# Patient Record
Sex: Male | Born: 1940 | Race: Black or African American | Hispanic: No | Marital: Married | Smoking: Current every day smoker
Health system: Southern US, Community
[De-identification: ages and names within clinical notes are randomized; demographics above are authoritative.]

## PROBLEM LIST (undated history)

## (undated) DIAGNOSIS — R05 Cough: Secondary | ICD-10-CM

## (undated) DIAGNOSIS — R059 Cough, unspecified: Secondary | ICD-10-CM

## (undated) DIAGNOSIS — E119 Type 2 diabetes mellitus without complications: Secondary | ICD-10-CM

## (undated) DIAGNOSIS — G709 Myoneural disorder, unspecified: Secondary | ICD-10-CM

## (undated) DIAGNOSIS — N189 Chronic kidney disease, unspecified: Secondary | ICD-10-CM

## (undated) DIAGNOSIS — E785 Hyperlipidemia, unspecified: Secondary | ICD-10-CM

## (undated) DIAGNOSIS — I739 Peripheral vascular disease, unspecified: Secondary | ICD-10-CM

## (undated) DIAGNOSIS — I10 Essential (primary) hypertension: Secondary | ICD-10-CM

## (undated) DIAGNOSIS — R600 Localized edema: Secondary | ICD-10-CM

## (undated) DIAGNOSIS — J309 Allergic rhinitis, unspecified: Secondary | ICD-10-CM

## (undated) DIAGNOSIS — M1990 Unspecified osteoarthritis, unspecified site: Secondary | ICD-10-CM

## (undated) HISTORY — PX: OTHER SURGICAL HISTORY: SHX169

## (undated) HISTORY — PX: COLONOSCOPY: SHX174

---

## 2011-05-16 ENCOUNTER — Other Ambulatory Visit: Payer: Self-pay | Admitting: Nephrology

## 2011-05-18 ENCOUNTER — Ambulatory Visit
Admission: RE | Admit: 2011-05-18 | Discharge: 2011-05-18 | Disposition: A | Discharge status: Home / Self Care | Payer: Medicare Other | Source: Ambulatory Visit | Attending: Nephrology | Admitting: Nephrology

## 2011-11-29 ENCOUNTER — Encounter (HOSPITAL_COMMUNITY): Payer: Self-pay | Admitting: Pharmacy Technician

## 2011-11-29 NOTE — H&P (Signed)
TOTAL KNEE ADMISSION H&P  Patient is being admitted for left total knee arthroplasty.  Subjective:  Chief Complaint:left knee pain.  HPI: Peter Bowen., 71 y.o. male, has a history of pain and functional disability in the left knee due to arthritis and has failed non-surgical conservative treatments for greater than 12 weeks to includeNSAID's and/or analgesics, corticosteriod injections, use of assistive devices and activity modification.  Onset of symptoms was gradual, starting >10 years ago with gradually worsening course since that time. The patient noted no past surgery on the left knee(s).  Patient currently rates pain in the left knee(s) at 7 out of 10 with activity. Patient has night pain, worsening of pain with activity and weight bearing, pain that interferes with activities of daily living and crepitus.  Patient has evidence of periarticular osteophytes and joint space narrowing by imaging studies. There is no active infection.  Past Medical History Diabetes Mellitus type II  Past Surgical History No previous surgeries   Current Outpatient Prescriptions on File Prior to Encounter  Medication Sig Dispense Refill  . amLODipine-benazepril (LOTREL) 10-20 MG per capsule Take 1 capsule by mouth daily with breakfast.      . glimepiride (AMARYL) 4 MG tablet Take 4 mg by mouth 2 (two) times daily.      . Insulin Aspart Prot & Aspart (NOVOLOG MIX 70/30 FLEXPEN [place]) Inject 30 Units into the skin 3 (three) times daily as needed.      . metoprolol succinate (TOPROL-XL) 50 MG 24 hr tablet Take 50 mg by mouth daily with breakfast. Take with or immediately following a meal.      . sitaGLIPtan-metformin (JANUMET) 50-1000 MG per tablet Take 1 tablet by mouth 2 (two) times daily with a meal.      Acetaminophen 500mg  Tramadol 50mg  Zanaflex 4mg    No Known Allergies   History  Substance Use Topics  . Smoking status: Smokes about half a pack a day  . Smokeless tobacco: No  . Alcohol Use:  1-2 beers a week    Family History Father: deceased age 49 due to complications from four previous MI's Mother: deceased age 82 due to complications from DM type II; required dialysis for renal dysfunction  Review of Systems  Constitutional: Negative.   HENT: Negative.   Eyes: Negative.   Respiratory: Positive for cough. Negative for hemoptysis, sputum production, shortness of breath and wheezing.   Cardiovascular: Negative.   Gastrointestinal: Negative.   Genitourinary: Negative.   Skin: Negative.     Objective:  Physical Exam  Constitutional: He is oriented to person, place, and time. He appears well-developed and well-nourished. No distress.  HENT:  Head: Normocephalic and atraumatic.  Right Ear: External ear normal.  Left Ear: External ear normal.  Nose: Nose normal.  Mouth/Throat: Oropharynx is clear and moist.  Eyes: Conjunctivae normal and EOM are normal.  Neck: Normal range of motion. Neck supple. No thyromegaly present.  Cardiovascular: Normal rate, regular rhythm, normal heart sounds and intact distal pulses.   No murmur heard. Respiratory: Effort normal and breath sounds normal. No respiratory distress. He has no wheezes. He exhibits no tenderness.  GI: Soft. Bowel sounds are normal. He exhibits no distension and no mass. There is no tenderness.  Musculoskeletal:       Right hip: Normal.       Left hip: Normal.       Right knee: Normal.       Left knee: He exhibits decreased range of motion, swelling and  deformity. He exhibits no erythema. tenderness found. Medial joint line tenderness noted.       Legs: Lymphadenopathy:    He has no cervical adenopathy.  Neurological: He is alert and oriented to person, place, and time.  Skin: No rash noted. He is not diaphoretic. No erythema.  Psychiatric: He has a normal mood and affect. His behavior is normal. Judgment and thought content normal.    Vital signs in last 24 hours: BP: 129/74 HR: 64  Weight: 248 lb  Height: 75.5 in Body Surface Area: 2.45 m Body Mass Index: 30.59 kg/m  Imaging Review Plain radiographs demonstrate severe degenerative joint disease of the left knee(s). The overall alignment issignificant varus. The bone quality appears to be good for age and reported activity level.  Assessment/Plan:  End stage arthritis, left knee   The patient history, physical examination, clinical judgment of the provider and imaging studies are consistent with end stage degenerative joint disease of the left knee(s) and total knee arthroplasty is deemed medically necessary. The treatment options including medical management, injection therapy arthroscopy and arthroplasty were discussed at length. The risks and benefits of total knee arthroplasty were presented and reviewed. The risks due to aseptic loosening, infection, stiffness, patella tracking problems, thromboembolic complications and other imponderables were discussed. The patient acknowledged the explanation, agreed to proceed with the plan and consent was signed. Patient is being admitted for inpatient treatment for surgery, pain control, PT, OT, prophylactic antibiotics, VTE prophylaxis, progressive ambulation and ADL's and discharge planning. The patient is planning to be discharged home with home health services      [place], [place]

## 2011-12-01 ENCOUNTER — Encounter (HOSPITAL_COMMUNITY): Payer: Self-pay

## 2011-12-01 ENCOUNTER — Ambulatory Visit (HOSPITAL_COMMUNITY)
Admission: RE | Admit: 2011-12-01 | Discharge: 2011-12-01 | Disposition: A | Discharge status: Home / Self Care | Payer: Medicare Other | Source: Ambulatory Visit | Attending: Surgical | Admitting: Surgical

## 2011-12-01 ENCOUNTER — Encounter (HOSPITAL_COMMUNITY)
Admission: RE | Admit: 2011-12-01 | Discharge: 2011-12-01 | Disposition: A | Discharge status: Home / Self Care | Payer: Medicare Other | Source: Ambulatory Visit | Attending: Orthopedic Surgery | Admitting: Orthopedic Surgery

## 2011-12-01 DIAGNOSIS — Z01818 Encounter for other preprocedural examination: Secondary | ICD-10-CM | POA: Insufficient documentation

## 2011-12-01 DIAGNOSIS — Z01812 Encounter for preprocedural laboratory examination: Secondary | ICD-10-CM | POA: Insufficient documentation

## 2011-12-01 DIAGNOSIS — I10 Essential (primary) hypertension: Secondary | ICD-10-CM | POA: Insufficient documentation

## 2011-12-01 DIAGNOSIS — E119 Type 2 diabetes mellitus without complications: Secondary | ICD-10-CM | POA: Insufficient documentation

## 2011-12-01 DIAGNOSIS — I517 Cardiomegaly: Secondary | ICD-10-CM | POA: Insufficient documentation

## 2011-12-01 HISTORY — DX: Localized edema: R60.0

## 2011-12-01 HISTORY — DX: Cough: R05

## 2011-12-01 HISTORY — DX: Unspecified osteoarthritis, unspecified site: M19.90

## 2011-12-01 HISTORY — DX: Chronic kidney disease, unspecified: N18.9

## 2011-12-01 HISTORY — DX: Peripheral vascular disease, unspecified: I73.9

## 2011-12-01 HISTORY — DX: Allergic rhinitis, unspecified: J30.9

## 2011-12-01 HISTORY — DX: Hyperlipidemia, unspecified: E78.5

## 2011-12-01 HISTORY — DX: Myoneural disorder, unspecified: G70.9

## 2011-12-01 HISTORY — DX: Essential (primary) hypertension: I10

## 2011-12-01 HISTORY — DX: Type 2 diabetes mellitus without complications: E11.9

## 2011-12-01 HISTORY — DX: Cough, unspecified: R05.9

## 2011-12-01 LAB — URINALYSIS, ROUTINE W REFLEX MICROSCOPIC
Glucose, UA: 100 mg/dL — AB
Hgb urine dipstick: NEGATIVE
Leukocytes, UA: NEGATIVE
Nitrite: NEGATIVE
Protein, ur: NEGATIVE mg/dL
Specific Gravity, Urine: 1.022 (ref 1.005–1.030)
Urobilinogen, UA: 1 mg/dL (ref 0.0–1.0)
pH: 5 (ref 5.0–8.0)

## 2011-12-01 LAB — COMPREHENSIVE METABOLIC PANEL
ALT: 18 U/L (ref 0–53)
AST: 25 U/L (ref 0–37)
Albumin: 4.1 g/dL (ref 3.5–5.2)
Alkaline Phosphatase: 58 U/L (ref 39–117)
BUN: 15 mg/dL (ref 6–23)
CO2: 24 mEq/L (ref 19–32)
Calcium: 9.6 mg/dL (ref 8.4–10.5)
Chloride: 101 mEq/L (ref 96–112)
Creatinine, Ser: 1.35 mg/dL (ref 0.50–1.35)
GFR calc Af Amer: 60 mL/min — ABNORMAL LOW (ref >90)
GFR calc non Af Amer: 52 mL/min — ABNORMAL LOW (ref >90)
Glucose, Bld: 84 mg/dL (ref 70–99)
Potassium: 4.4 mEq/L (ref 3.5–5.1)
Sodium: 135 mEq/L (ref 135–145)
Total Bilirubin: 0.4 mg/dL (ref 0.3–1.2)
Total Protein: 8.5 g/dL — ABNORMAL HIGH (ref 6.0–8.3)

## 2011-12-01 LAB — CBC
MCV: 95.4 fL (ref 78.0–100.0)
Platelets: 236 10*3/uL (ref 150–400)
RBC: 3.88 MIL/uL — ABNORMAL LOW (ref 4.22–5.81)
WBC: 5.5 10*3/uL (ref 4.0–10.5)

## 2011-12-01 LAB — SURGICAL PCR SCREEN: Staphylococcus aureus: NEGATIVE

## 2011-12-01 LAB — PROTIME-INR
INR: 1.02 (ref 0.00–1.49)
Prothrombin Time: 13.3 seconds (ref 11.6–15.2)

## 2011-12-01 LAB — APTT: aPTT: 32 seconds (ref 24–37)

## 2011-12-01 NOTE — Progress Notes (Signed)
EKG, OV with clearance and recommendations dr Julio Sicks 11/17/11 on chart

## 2011-12-01 NOTE — Progress Notes (Signed)
Faxed copy of urine through [facility] for review to Dr Darrelyn Hillock

## 2011-12-01 NOTE — Patient Instructions (Signed)
20 Peter Bowen.  12/01/2011   Your procedure is scheduled on: 12/06/11  Wednesday   Surgery [id number]   Report to [facility] at   1115    AM.  Call this number if you have problems the morning of surgery: [phone]         Remember: TAKE ONE HALF INSULIN DOSAGE Tuesday NIGHT AND NO DIABETIC MEDICINES THE MORNING OF SURGERY  Do not eat food  after midnight Tuesday NIGHT       MAY HAVE CLEAR LIQUIDS Wednesday MORNING UNTIL  0515 AM   THEN NOTHING IN YOUR MOUTH BUT MEDICINES AS LISTED.   DO DRINK SOMETHING CLEAR WITH CALORIES IN IT BEFORE 515 Wednesday MORNING     Take these medicines the morning of surgery with A SIP OF WATER: METOPROLOL                                    MAY TAKE TRAMADOL WITH SIP WATER IF NEEDED    Contacts, dentures or partial plates can not be worn to surgery  Leave suitcase in the car. After surgery it may be brought to your room.  For patients admitted to the hospital, checkout time is 11:00 AM day of  discharge.             SPECIAL INSTRUCTIONS- SEE [facility] PREPARING FOR SURGERY INSTRUCTION SHEET-     DO NOT WEAR JEWELRY, LOTIONS, POWDERS, OR PERFUMES.  WOMEN-- DO NOT SHAVE LEGS OR UNDERARMS FOR 12 HOURS BEFORE SHOWERS. MEN MAY SHAVE FACE.  Patients discharged the day of surgery will not be allowed to drive home. IF going home the day of surgery, you must have a driver and someone to stay with you for the first 24 hours  Name and phone number of your driver:  wife                                                                     Please read over the following fact sheets that you were given: MRSA Information, Incentive Spirometry Sheet, Blood Transfusion Sheet  Information                                                                                   Peter Bowen  PST [phone]

## 2011-12-01 NOTE — Progress Notes (Signed)
States will take 1/2 insulin dosage night before surgery -"ONLY IF I NEED IT- MAY NOT HAVE TO TAKE IT"   I CONFIRMED THAT THIS IS CORRECT

## 2011-12-04 NOTE — Progress Notes (Signed)
12-04-11 1630 Pt. Made aware pf surgery time change to 1130 Am and to arrive by 0900 AM- do not take any Diabetic meds AM of, but take Blood pressure med as previous instructed by Lezlie Suits,RN. Donot eat or drink after Midnight except take med with water only. W. Cinderella Christoffersen,RN 

## 2011-12-06 ENCOUNTER — Inpatient Hospital Stay (HOSPITAL_COMMUNITY)
Admission: RE | Admit: 2011-12-06 | Discharge: 2011-12-12 | DRG: 469 | Disposition: A | Discharge status: Home Health | Payer: Medicare Other | Source: Ambulatory Visit | Attending: Orthopedic Surgery | Admitting: Orthopedic Surgery

## 2011-12-06 ENCOUNTER — Inpatient Hospital Stay (HOSPITAL_COMMUNITY): Payer: Medicare Other

## 2011-12-06 ENCOUNTER — Encounter (HOSPITAL_COMMUNITY): Payer: Self-pay | Admitting: Anesthesiology

## 2011-12-06 ENCOUNTER — Encounter (HOSPITAL_COMMUNITY): Payer: Self-pay | Admitting: *Deleted

## 2011-12-06 ENCOUNTER — Inpatient Hospital Stay (HOSPITAL_COMMUNITY): Payer: Medicare Other | Admitting: Anesthesiology

## 2011-12-06 ENCOUNTER — Encounter (HOSPITAL_COMMUNITY)
Admission: RE | Disposition: A | Discharge status: Home Health | Payer: Self-pay | Source: Ambulatory Visit | Attending: Orthopedic Surgery

## 2011-12-06 DIAGNOSIS — G9340 Encephalopathy, unspecified: Secondary | ICD-10-CM | POA: Diagnosis not present

## 2011-12-06 DIAGNOSIS — Z96659 Presence of unspecified artificial knee joint: Secondary | ICD-10-CM

## 2011-12-06 DIAGNOSIS — N183 Chronic kidney disease, stage 3 unspecified: Secondary | ICD-10-CM

## 2011-12-06 DIAGNOSIS — E871 Hypo-osmolality and hyponatremia: Secondary | ICD-10-CM | POA: Diagnosis present

## 2011-12-06 DIAGNOSIS — N390 Urinary tract infection, site not specified: Secondary | ICD-10-CM | POA: Diagnosis not present

## 2011-12-06 DIAGNOSIS — M24569 Contracture, unspecified knee: Secondary | ICD-10-CM | POA: Diagnosis present

## 2011-12-06 DIAGNOSIS — D62 Acute posthemorrhagic anemia: Secondary | ICD-10-CM | POA: Diagnosis not present

## 2011-12-06 DIAGNOSIS — G9349 Other encephalopathy: Secondary | ICD-10-CM | POA: Diagnosis not present

## 2011-12-06 DIAGNOSIS — I129 Hypertensive chronic kidney disease with stage 1 through stage 4 chronic kidney disease, or unspecified chronic kidney disease: Secondary | ICD-10-CM | POA: Diagnosis present

## 2011-12-06 DIAGNOSIS — M1712 Unilateral primary osteoarthritis, left knee: Secondary | ICD-10-CM | POA: Diagnosis present

## 2011-12-06 DIAGNOSIS — R509 Fever, unspecified: Secondary | ICD-10-CM

## 2011-12-06 DIAGNOSIS — M1710 Unilateral primary osteoarthritis, unspecified knee: Principal | ICD-10-CM | POA: Diagnosis present

## 2011-12-06 DIAGNOSIS — E119 Type 2 diabetes mellitus without complications: Secondary | ICD-10-CM

## 2011-12-06 DIAGNOSIS — I10 Essential (primary) hypertension: Secondary | ICD-10-CM

## 2011-12-06 DIAGNOSIS — E876 Hypokalemia: Secondary | ICD-10-CM | POA: Diagnosis not present

## 2011-12-06 HISTORY — PX: TOTAL KNEE ARTHROPLASTY: SHX125

## 2011-12-06 LAB — GLUCOSE, CAPILLARY
Glucose-Capillary: 118 mg/dL — ABNORMAL HIGH (ref 70–99)
Glucose-Capillary: 200 mg/dL — ABNORMAL HIGH (ref 70–99)

## 2011-12-06 LAB — TYPE AND SCREEN
ABO/RH(D): B POS
Antibody Screen: NEGATIVE

## 2011-12-06 LAB — ABO/RH: ABO/RH(D): B POS

## 2011-12-06 SURGERY — ARTHROPLASTY, KNEE, TOTAL
Anesthesia: General | Site: Knee | Laterality: Left | Wound class: Clean

## 2011-12-06 MED ORDER — ONDANSETRON HCL 4 MG/2ML IJ SOLN: 4.0000 mg | Freq: Four times a day (QID) | INTRAMUSCULAR | Status: DC | PRN

## 2011-12-06 MED ORDER — ALUM & MAG HYDROXIDE-SIMETH 200-200-20 MG/5ML PO SUSP: 30.0000 mL | ORAL | Status: DC | PRN

## 2011-12-06 MED ORDER — ROCURONIUM BROMIDE 100 MG/10ML IV SOLN
INTRAVENOUS | Status: DC | PRN
  Administered 2011-12-06: 50 mg via INTRAVENOUS

## 2011-12-06 MED ORDER — BENAZEPRIL HCL 20 MG PO TABS
20.0000 mg | ORAL_TABLET | Freq: Every day | ORAL | Status: DC
  Administered 2011-12-07 – 2011-12-12 (×6): 20 mg via ORAL
  Filled 2011-12-06 (×6): qty 1

## 2011-12-06 MED ORDER — ONDANSETRON HCL 4 MG PO TABS: 4.0000 mg | ORAL_TABLET | Freq: Four times a day (QID) | ORAL | Status: DC | PRN

## 2011-12-06 MED ORDER — PROMETHAZINE HCL 25 MG/ML IJ SOLN: 6.2500 mg | INTRAMUSCULAR | Status: DC | PRN

## 2011-12-06 MED ORDER — PROPOFOL 10 MG/ML IV BOLUS
INTRAVENOUS | Status: DC | PRN
  Administered 2011-12-06: 200 mg via INTRAVENOUS

## 2011-12-06 MED ORDER — AMLODIPINE BESYLATE 10 MG PO TABS
10.0000 mg | ORAL_TABLET | Freq: Once | ORAL | Status: AC
  Administered 2011-12-06: 10 mg via ORAL
  Filled 2011-12-06: qty 1

## 2011-12-06 MED ORDER — CEFAZOLIN SODIUM-DEXTROSE 2-3 GM-% IV SOLR
2.0000 g | INTRAVENOUS | Status: AC
  Administered 2011-12-06: 2 g via INTRAVENOUS

## 2011-12-06 MED ORDER — LACTATED RINGERS IV SOLN: INTRAVENOUS | Status: DC

## 2011-12-06 MED ORDER — METHOCARBAMOL 100 MG/ML IJ SOLN
500.0000 mg | Freq: Four times a day (QID) | INTRAVENOUS | Status: DC | PRN
  Administered 2011-12-06: 500 mg via INTRAVENOUS
  Filled 2011-12-06: qty 5

## 2011-12-06 MED ORDER — SODIUM CHLORIDE 0.9 % IR SOLN
Status: DC | PRN
  Administered 2011-12-06: 1

## 2011-12-06 MED ORDER — TIZANIDINE HCL 4 MG PO TABS
4.0000 mg | ORAL_TABLET | Freq: Four times a day (QID) | ORAL | Status: DC | PRN
  Filled 2011-12-06: qty 1

## 2011-12-06 MED ORDER — ONDANSETRON HCL 4 MG/2ML IJ SOLN
INTRAMUSCULAR | Status: DC | PRN
  Administered 2011-12-06: 4 mg via INTRAVENOUS

## 2011-12-06 MED ORDER — GLIMEPIRIDE 4 MG PO TABS
4.0000 mg | ORAL_TABLET | Freq: Two times a day (BID) | ORAL | Status: DC
  Administered 2011-12-07 – 2011-12-12 (×11): 4 mg via ORAL
  Filled 2011-12-06 (×15): qty 1

## 2011-12-06 MED ORDER — METOPROLOL SUCCINATE ER 50 MG PO TB24
50.0000 mg | ORAL_TABLET | Freq: Every day | ORAL | Status: DC
  Administered 2011-12-07 – 2011-12-12 (×6): 50 mg via ORAL
  Filled 2011-12-06 (×6): qty 1

## 2011-12-06 MED ORDER — AMLODIPINE BESYLATE 10 MG PO TABS
10.0000 mg | ORAL_TABLET | Freq: Every day | ORAL | Status: DC
  Administered 2011-12-07 – 2011-12-12 (×6): 10 mg via ORAL
  Filled 2011-12-06 (×6): qty 1

## 2011-12-06 MED ORDER — HYDROCODONE-ACETAMINOPHEN 5-325 MG PO TABS
1.0000 | ORAL_TABLET | ORAL | Status: DC | PRN
  Administered 2011-12-06 (×2): 1 via ORAL
  Administered 2011-12-07 – 2011-12-08 (×5): 2 via ORAL
  Administered 2011-12-08: 1 via ORAL
  Administered 2011-12-08: 2 via ORAL
  Filled 2011-12-06 (×6): qty 2
  Filled 2011-12-06: qty 1
  Filled 2011-12-06 (×2): qty 2
  Filled 2011-12-06 (×2): qty 1
  Filled 2011-12-06: qty 2
  Filled 2011-12-06: qty 1

## 2011-12-06 MED ORDER — RIVAROXABAN 10 MG PO TABS
10.0000 mg | ORAL_TABLET | Freq: Every day | ORAL | Status: DC
  Administered 2011-12-07 – 2011-12-12 (×6): 10 mg via ORAL
  Filled 2011-12-06 (×8): qty 1

## 2011-12-06 MED ORDER — POLYETHYLENE GLYCOL 3350 17 G PO PACK: 17.0000 g | PACK | Freq: Every day | ORAL | Status: DC | PRN

## 2011-12-06 MED ORDER — BUPIVACAINE LIPOSOME 1.3 % IJ SUSP
20.0000 mL | INTRAMUSCULAR | Status: AC
  Administered 2011-12-06: 20 mL
  Filled 2011-12-06: qty 20

## 2011-12-06 MED ORDER — METFORMIN HCL 500 MG PO TABS
1000.0000 mg | ORAL_TABLET | Freq: Two times a day (BID) | ORAL | Status: DC
  Administered 2011-12-07 – 2011-12-10 (×7): 1000 mg via ORAL
  Filled 2011-12-06 (×11): qty 2

## 2011-12-06 MED ORDER — MIDAZOLAM HCL 5 MG/5ML IJ SOLN
INTRAMUSCULAR | Status: DC | PRN
  Administered 2011-12-06: 2 mg via INTRAVENOUS

## 2011-12-06 MED ORDER — AMLODIPINE BESY-BENAZEPRIL HCL 10-20 MG PO CAPS: 1.0000 | ORAL_CAPSULE | Freq: Every day | ORAL | Status: DC

## 2011-12-06 MED ORDER — METHOCARBAMOL 500 MG PO TABS
500.0000 mg | ORAL_TABLET | Freq: Four times a day (QID) | ORAL | Status: DC | PRN
  Administered 2011-12-06 – 2011-12-10 (×5): 500 mg via ORAL
  Filled 2011-12-06 (×7): qty 1

## 2011-12-06 MED ORDER — ACETAMINOPHEN 325 MG PO TABS
650.0000 mg | ORAL_TABLET | Freq: Four times a day (QID) | ORAL | Status: DC | PRN
  Administered 2011-12-08 – 2011-12-12 (×6): 650 mg via ORAL
  Filled 2011-12-06 (×6): qty 2

## 2011-12-06 MED ORDER — ACETAMINOPHEN 10 MG/ML IV SOLN
INTRAVENOUS | Status: DC | PRN
  Administered 2011-12-06 (×2): 1000 mg via INTRAVENOUS

## 2011-12-06 MED ORDER — FERROUS SULFATE 325 (65 FE) MG PO TABS
325.0000 mg | ORAL_TABLET | Freq: Three times a day (TID) | ORAL | Status: DC
  Administered 2011-12-07 – 2011-12-12 (×13): 325 mg via ORAL
  Filled 2011-12-06 (×20): qty 1

## 2011-12-06 MED ORDER — PHENOL 1.4 % MT LIQD
1.0000 | OROMUCOSAL | Status: DC | PRN
  Filled 2011-12-06: qty 177

## 2011-12-06 MED ORDER — INSULIN ASPART 100 UNIT/ML ~~LOC~~ SOLN
0.0000 [IU] | Freq: Three times a day (TID) | SUBCUTANEOUS | Status: DC
  Administered 2011-12-07: 5 [IU] via SUBCUTANEOUS
  Administered 2011-12-07: 3 [IU] via SUBCUTANEOUS
  Administered 2011-12-07 (×2): 5 [IU] via SUBCUTANEOUS
  Administered 2011-12-08 (×2): 3 [IU] via SUBCUTANEOUS
  Administered 2011-12-08 – 2011-12-09 (×2): 5 [IU] via SUBCUTANEOUS
  Administered 2011-12-09: 3 [IU] via SUBCUTANEOUS
  Administered 2011-12-09: 5 [IU] via SUBCUTANEOUS
  Administered 2011-12-10 – 2011-12-11 (×2): 8 [IU] via SUBCUTANEOUS
  Administered 2011-12-11: 11 [IU] via SUBCUTANEOUS
  Administered 2011-12-11: 8 [IU] via SUBCUTANEOUS
  Administered 2011-12-12: 5 [IU] via SUBCUTANEOUS

## 2011-12-06 MED ORDER — CEFAZOLIN SODIUM 1-5 GM-% IV SOLN
1.0000 g | Freq: Four times a day (QID) | INTRAVENOUS | Status: AC
  Administered 2011-12-06 (×2): 1 g via INTRAVENOUS
  Filled 2011-12-06 (×2): qty 50

## 2011-12-06 MED ORDER — FENTANYL CITRATE 0.05 MG/ML IJ SOLN
INTRAMUSCULAR | Status: DC | PRN
  Administered 2011-12-06: 50 ug via INTRAVENOUS
  Administered 2011-12-06 (×2): 100 ug via INTRAVENOUS

## 2011-12-06 MED ORDER — LINAGLIPTIN 5 MG PO TABS
5.0000 mg | ORAL_TABLET | Freq: Every day | ORAL | Status: DC
  Administered 2011-12-07 – 2011-12-12 (×6): 5 mg via ORAL
  Filled 2011-12-06 (×6): qty 1

## 2011-12-06 MED ORDER — BISACODYL 10 MG RE SUPP: 10.0000 mg | Freq: Every day | RECTAL | Status: DC | PRN

## 2011-12-06 MED ORDER — LIDOCAINE HCL 1 % IJ SOLN
INTRAMUSCULAR | Status: DC | PRN
  Administered 2011-12-06: 50 mg via INTRADERMAL

## 2011-12-06 MED ORDER — OXYCODONE-ACETAMINOPHEN 5-325 MG PO TABS: 2.0000 | ORAL_TABLET | ORAL | Status: DC | PRN

## 2011-12-06 MED ORDER — HYDROMORPHONE HCL PF 1 MG/ML IJ SOLN
0.2500 mg | INTRAMUSCULAR | Status: DC | PRN
Start: 2011-12-06 — End: 2011-12-06
  Administered 2011-12-06: 0.5 mg via INTRAVENOUS

## 2011-12-06 MED ORDER — LACTATED RINGERS IV SOLN
INTRAVENOUS | Status: DC
  Administered 2011-12-06 (×2): 1000 mL via INTRAVENOUS

## 2011-12-06 MED ORDER — HYDROMORPHONE HCL PF 1 MG/ML IJ SOLN
1.0000 mg | INTRAMUSCULAR | Status: DC | PRN
  Administered 2011-12-06 – 2011-12-07 (×3): 1 mg via INTRAVENOUS
  Filled 2011-12-06 (×3): qty 1

## 2011-12-06 MED ORDER — LACTATED RINGERS IV SOLN
INTRAVENOUS | Status: DC
  Administered 2011-12-06 – 2011-12-08 (×4): via INTRAVENOUS

## 2011-12-06 MED ORDER — LACTATED RINGERS IV SOLN
INTRAVENOUS | Status: DC | PRN
  Administered 2011-12-06 (×3): via INTRAVENOUS

## 2011-12-06 MED ORDER — SODIUM CHLORIDE 0.9 % IR SOLN
Status: DC | PRN
  Administered 2011-12-06: 12:00:00

## 2011-12-06 MED ORDER — SITAGLIPTIN PHOS-METFORMIN HCL 50-1000 MG PO TABS: 1.0000 | ORAL_TABLET | Freq: Two times a day (BID) | ORAL | Status: DC

## 2011-12-06 MED ORDER — HYDROMORPHONE HCL PF 1 MG/ML IJ SOLN
INTRAMUSCULAR | Status: DC | PRN
  Administered 2011-12-06 (×2): 1 mg via INTRAVENOUS

## 2011-12-06 MED ORDER — THROMBIN 5000 UNITS EX SOLR
CUTANEOUS | Status: DC | PRN
  Administered 2011-12-06: 5000 [IU] via TOPICAL

## 2011-12-06 MED ORDER — ACETAMINOPHEN 650 MG RE SUPP: 650.0000 mg | Freq: Four times a day (QID) | RECTAL | Status: DC | PRN

## 2011-12-06 MED ORDER — FLEET ENEMA 7-19 GM/118ML RE ENEM: 1.0000 | ENEMA | Freq: Once | RECTAL | Status: AC | PRN

## 2011-12-06 MED ORDER — MENTHOL 3 MG MT LOZG
1.0000 | LOZENGE | OROMUCOSAL | Status: DC | PRN
  Filled 2011-12-06: qty 9

## 2011-12-06 SURGICAL SUPPLY — 66 items
BAG ZIPLOCK 12X15 (MISCELLANEOUS) ×4 IMPLANT
BANDAGE ELASTIC 4 VELCRO ST LF (GAUZE/BANDAGES/DRESSINGS) ×2 IMPLANT
BANDAGE ELASTIC 6 VELCRO ST LF (GAUZE/BANDAGES/DRESSINGS) ×2 IMPLANT
BANDAGE ESMARK 6X9 LF (GAUZE/BANDAGES/DRESSINGS) ×1 IMPLANT
BANDAGE GAUZE ELAST BULKY 4 IN (GAUZE/BANDAGES/DRESSINGS) ×2 IMPLANT
BLADE SAG 18X100X1.27 (BLADE) ×2 IMPLANT
BLADE SAW SGTL 11.0X1.19X90.0M (BLADE) ×2 IMPLANT
BNDG ESMARK 6X9 LF (GAUZE/BANDAGES/DRESSINGS) ×2
BONE CEMENT GENTAMICIN (Cement) ×4 IMPLANT
CEMENT BONE GENTAMICIN 40 (Cement) ×2 IMPLANT
CLOTH BEACON ORANGE TIMEOUT ST (SAFETY) ×2 IMPLANT
CLSR STERI-STRIP ANTIMIC 1/2X4 (GAUZE/BANDAGES/DRESSINGS) ×2 IMPLANT
CUFF TOURN SGL QUICK 34 (TOURNIQUET CUFF) ×1
CUFF TRNQT CYL 34X4X40X1 (TOURNIQUET CUFF) ×1 IMPLANT
DRAPE EXTREMITY T 121X128X90 (DRAPE) ×2 IMPLANT
DRAPE INCISE IOBAN 66X45 STRL (DRAPES) ×2 IMPLANT
DRAPE LG THREE QUARTER DISP (DRAPES) ×2 IMPLANT
DRAPE POUCH INSTRU U-SHP 10X18 (DRAPES) ×2 IMPLANT
DRAPE U-SHAPE 47X51 STRL (DRAPES) ×2 IMPLANT
DRSG ADAPTIC 3X8 NADH LF (GAUZE/BANDAGES/DRESSINGS) ×2 IMPLANT
DRSG PAD ABDOMINAL 8X10 ST (GAUZE/BANDAGES/DRESSINGS) ×2 IMPLANT
DURAPREP 26ML APPLICATOR (WOUND CARE) ×2 IMPLANT
ELECT REM PT RETURN 9FT ADLT (ELECTROSURGICAL) ×2
ELECTRODE REM PT RTRN 9FT ADLT (ELECTROSURGICAL) ×1 IMPLANT
EVACUATOR 1/8 PVC DRAIN (DRAIN) ×2 IMPLANT
FACESHIELD LNG OPTICON STERILE (SAFETY) ×12 IMPLANT
GLOVE BIOGEL PI IND STRL 8 (GLOVE) ×1 IMPLANT
GLOVE BIOGEL PI IND STRL 8.5 (GLOVE) IMPLANT
GLOVE BIOGEL PI INDICATOR 8 (GLOVE) ×1
GLOVE BIOGEL PI INDICATOR 8.5 (GLOVE)
GLOVE ECLIPSE 8.0 STRL XLNG CF (GLOVE) ×6 IMPLANT
GLOVE SURG SS PI 6.5 STRL IVOR (GLOVE) ×4 IMPLANT
GOWN PREVENTION PLUS LG XLONG (DISPOSABLE) ×4 IMPLANT
GOWN STRL REIN XL XLG (GOWN DISPOSABLE) ×2 IMPLANT
HANDPIECE INTERPULSE COAX TIP (DISPOSABLE) ×1
IMMOBILIZER KNEE 20 (SOFTGOODS) ×2
IMMOBILIZER KNEE 20 THIGH 36 (SOFTGOODS) ×1 IMPLANT
KIT BASIN OR (CUSTOM PROCEDURE TRAY) ×2 IMPLANT
MANIFOLD NEPTUNE II (INSTRUMENTS) ×2 IMPLANT
NEEDLE HYPO 22GX1.5 SAFETY (NEEDLE) ×2 IMPLANT
NS IRRIG 1000ML POUR BTL (IV SOLUTION) IMPLANT
PACK TOTAL JOINT (CUSTOM PROCEDURE TRAY) ×2 IMPLANT
POSITIONER SURGICAL ARM (MISCELLANEOUS) ×2 IMPLANT
SET HNDPC FAN SPRY TIP SCT (DISPOSABLE) ×1 IMPLANT
SET PAD KNEE POSITIONER (MISCELLANEOUS) ×2 IMPLANT
SPONGE GAUZE 4X4 12PLY (GAUZE/BANDAGES/DRESSINGS) ×2 IMPLANT
SPONGE LAP 18X18 X RAY DECT (DISPOSABLE) ×2 IMPLANT
SPONGE SURGIFOAM ABS GEL 100 (HEMOSTASIS) ×2 IMPLANT
STAPLER VISISTAT 35W (STAPLE) IMPLANT
SUCTION FRAZIER 12FR DISP (SUCTIONS) ×2 IMPLANT
SUT BONE WAX W31G (SUTURE) ×2 IMPLANT
SUT MNCRL 0 MO-4 VIOLET 18 CR (SUTURE) ×1 IMPLANT
SUT MONOCRYL 0 MO 4 18  CR/8 (SUTURE) ×1
SUT VIC AB 0 CT1 27 (SUTURE) ×2
SUT VIC AB 0 CT1 27XBRD ANTBC (SUTURE) ×2 IMPLANT
SUT VIC AB 1 CT1 27 (SUTURE) ×6
SUT VIC AB 1 CT1 27XBRD ANTBC (SUTURE) ×6 IMPLANT
SUT VIC AB 2-0 CT1 27 (SUTURE) ×3
SUT VIC AB 2-0 CT1 TAPERPNT 27 (SUTURE) ×3 IMPLANT
SUT VLOC 180 0 24IN GS25 (SUTURE) ×2 IMPLANT
SYR 20CC LL (SYRINGE) ×2 IMPLANT
TOWEL OR 17X26 10 PK STRL BLUE (TOWEL DISPOSABLE) ×4 IMPLANT
TOWER CARTRIDGE SMART MIX (DISPOSABLE) ×2 IMPLANT
TRAY FOLEY CATH 14FRSI W/METER (CATHETERS) ×2 IMPLANT
WATER STERILE IRR 1500ML POUR (IV SOLUTION) ×6 IMPLANT
WRAP KNEE MAXI GEL POST OP (GAUZE/BANDAGES/DRESSINGS) ×4 IMPLANT

## 2011-12-06 NOTE — Anesthesia Preprocedure Evaluation (Signed)
Anesthesia Evaluation  Patient identified by MRN, date of birth, ID band Patient awake    Reviewed: Allergy & Precautions, H&P , NPO status , Patient's Chart, lab work & pertinent test results  Airway Mallampati: II TM Distance: >3 FB Neck ROM: Full    Dental  (+) Partial Upper and Edentulous Lower   Pulmonary neg pulmonary ROS,  breath sounds clear to auscultation  Pulmonary exam normal       Cardiovascular hypertension, Pt. on medications and Pt. on home beta blockers + Peripheral Vascular Disease Rhythm:Regular Rate:Normal     Neuro/Psych  Neuromuscular disease negative neurological ROS  negative psych ROS   GI/Hepatic negative GI ROS, Neg liver ROS,   Endo/Other  negative endocrine ROSdiabetes, Type 2  Renal/GU Renal InsufficiencyRenal disease  negative genitourinary   Musculoskeletal negative musculoskeletal ROS (+)   Abdominal   Peds  Hematology negative hematology ROS (+)   Anesthesia Other Findings   Reproductive/Obstetrics negative OB ROS                           Anesthesia Physical Anesthesia Plan  ASA: II  Anesthesia Plan: General   Post-op Pain Management:    Induction: Intravenous  Airway Management Planned: Oral ETT  Additional Equipment:   Intra-op Plan:   Post-operative Plan: Extubation in OR  Informed Consent: I have reviewed the patients History and Physical, chart, labs and discussed the procedure including the risks, benefits and alternatives for the proposed anesthesia with the patient or authorized representative who has indicated his/her understanding and acceptance.   Dental advisory given  Plan Discussed with: CRNA  Anesthesia Plan Comments:         Anesthesia Quick Evaluation

## 2011-12-06 NOTE — H&P (View-Only) (Signed)
12-04-11 1630 Pt. Made aware pf surgery time change to 1130 Am and to arrive by 0900 AM- do not take any Diabetic meds AM of, but take Blood pressure med as previous instructed by Haze Justin. Donot eat or drink after Midnight except take med with water only. W. Kennon Portela

## 2011-12-06 NOTE — Anesthesia Postprocedure Evaluation (Signed)
Anesthesia Post Note  Patient: Peter Bowen.  Procedure(s) Performed: Procedure(s) (LRB): TOTAL KNEE ARTHROPLASTY (Left)  Anesthesia type: General  Patient location: PACU  Post pain: Pain level controlled  Post assessment: Post-op Vital signs reviewed  Last Vitals:  Filed Vitals:   12/06/11 1515  BP: 129/74  Pulse: 64  Temp:   Resp: 11    Post vital signs: Reviewed  Level of consciousness: sedated  Complications: No apparent anesthesia complications

## 2011-12-06 NOTE — Interval H&P Note (Signed)
History and Physical Interval Note:  12/06/2011 11:00 AM  Peter Bowen.  has presented today for surgery, with the diagnosis of Osteoarthritis of the Left Knee  The various methods of treatment have been discussed with the patient and family. After consideration of risks, benefits and other options for treatment, the patient has consented to  Procedure(s) (LRB) with comments: TOTAL KNEE ARTHROPLASTY (Left) as a surgical intervention .  The patient's history has been reviewed, patient examined, no change in status, stable for surgery.  I have reviewed the patient's chart and labs.  Questions were answered to the patient's satisfaction.     Peter Bowen A

## 2011-12-06 NOTE — Transfer of Care (Signed)
Immediate Anesthesia Transfer of Care Note  Patient: Peter Bowen.  Procedure(s) Performed: Procedure(s) (LRB) with comments: TOTAL KNEE ARTHROPLASTY (Left)  Patient Location: PACU  Anesthesia Type: General  Level of Consciousness: awake and alert   Airway & Oxygen Therapy: Patient Spontanous Breathing and Patient connected to face mask oxygen  Post-op Assessment: Report given to PACU RN and Post -op Vital signs reviewed and stable  Post vital signs: Reviewed and stable  Complications: No apparent anesthesia complications

## 2011-12-06 NOTE — Brief Op Note (Signed)
12/06/2011  2:00 PM  PATIENT:  Peter Bowen.  71 y.o. 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  SURGEON:  Surgeon(s) and Role:    * Jacki Cones, MD - Primary  PHYSICIAN ASSISTANT: Dimitri Ped PA  ASSISTANTS: Dimitri Ped PA   ANESTHESIA:   general  EBL:  Total I/O In: 2000 [I.V.:2000] Out: 450 [Urine:250; Blood:200]  BLOOD ADMINISTERED:none  DRAINS: (one) Hemovact drain(s) in the Left with  Suction Open   LOCAL MEDICATIONS USED:  BUPIVICAINE20cc mixed with 20cc Normal Saline  SPECIMEN:  No Specimen  DISPOSITION OF SPECIMEN:  N/A  COUNTS:  YES  TOURNIQUET:   Total Tourniquet Time Documented: Thigh (Left) - 123 minutes  DICTATION: .Other Dictation: Dictation Number [id number]  PLAN OF CARE: Admit to inpatient   PATIENT DISPOSITION:  Stable in OR   Delay start of Pharmacological VTE agent (>24hrs) due to surgical blood loss or risk of bleeding: yes

## 2011-12-06 NOTE — Plan of Care (Signed)
Problem: Consults Goal: Diagnosis- Total Joint Replacement Outcome: Completed/Met Date Met:  12/06/11 Right total knee

## 2011-12-07 ENCOUNTER — Encounter (HOSPITAL_COMMUNITY): Payer: Self-pay | Admitting: Orthopedic Surgery

## 2011-12-07 DIAGNOSIS — D62 Acute posthemorrhagic anemia: Secondary | ICD-10-CM | POA: Diagnosis not present

## 2011-12-07 LAB — CBC
HCT: 28.4 % — ABNORMAL LOW (ref 39.0–52.0)
Hemoglobin: 9.8 g/dL — ABNORMAL LOW (ref 13.0–17.0)
MCV: 95.3 fL (ref 78.0–100.0)
Platelets: 179 10*3/uL (ref 150–400)
RBC: 2.98 MIL/uL — ABNORMAL LOW (ref 4.22–5.81)
WBC: 6.8 10*3/uL (ref 4.0–10.5)

## 2011-12-07 LAB — GLUCOSE, CAPILLARY
Glucose-Capillary: 224 mg/dL — ABNORMAL HIGH (ref 70–99)
Glucose-Capillary: 222 mg/dL — ABNORMAL HIGH (ref 70–99)
Glucose-Capillary: 203 mg/dL — ABNORMAL HIGH (ref 70–99)

## 2011-12-07 LAB — BASIC METABOLIC PANEL
BUN: 14 mg/dL (ref 6–23)
CO2: 23 mEq/L (ref 19–32)
Chloride: 98 mEq/L (ref 96–112)
Creatinine, Ser: 1.09 mg/dL (ref 0.50–1.35)
Glucose, Bld: 229 mg/dL — ABNORMAL HIGH (ref 70–99)

## 2011-12-07 NOTE — Progress Notes (Signed)
Inpatient Diabetes Program Recommendations  AACE/ADA: New Consensus Statement on Inpatient Glycemic Control (2013)  Target Ranges:  Prepandial:   less than 140 mg/dL      Peak postprandial:   less than 180 mg/dL (1-2 hours)      Critically ill patients:  140 - 180 mg/dL   Reason for Visit: Hyperglycemia   Peter Bowen., 71 y.o. male, has a history of pain and functional disability in the left knee due to arthritis and has failed non-surgical conservative treatments for greater than 12 weeks to includeNSAID's and/or analgesics, corticosteriod injections, use of assistive devices and activity modification.  Hx Type 2 DM. Checks blood sugars daily at home.      Current Outpatient Prescriptions on File Prior to Encounter   Medication  Sig  Dispense  Refill   .  amLODipine-benazepril (LOTREL) 10-20 MG per capsule  Take 1 capsule by mouth daily with breakfast.     .  glimepiride (AMARYL) 4 MG tablet  Take 4 mg by mouth 2 (two) times daily.     .  Insulin Aspart Prot & Aspart (NOVOLOG MIX 70/30 FLEXPEN [place])  Inject 30 Units into the skin 3 (three) times daily as needed.     .  metoprolol succinate (TOPROL-XL) 50 MG 24 hr tablet  Take 50 mg by mouth daily with breakfast. Take with or immediately following a meal.     .  sitaGLIPtan-metformin (JANUMET) 50-1000 MG per tablet  Take 1 tablet by mouth 2 (two) times daily with a meal.     Acetaminophen 500mg   Tramadol 50mg   Zanaflex 4mg    Results for Peter Bowen, Peter Bowen (MRN [id number]) as of 12/07/2011 15:09  Ref. Range 12/06/2011 09:23 12/06/2011 15:05 12/06/2011 22:13 12/07/2011 07:02 12/07/2011 11:49  Glucose-Capillary Latest Range: 70-99 mg/dL 409 (H) 811 (H) 914 (H) 224 (H) 177 (H)   Results for Peter Bowen, Peter Bowen (MRN [id number]) as of 12/07/2011 15:09  Ref. Range 12/07/2011 03:45  Sodium Latest Range: 135-145 mEq/L 131 (L)  Potassium Latest Range: 3.5-5.1 mEq/L 4.1  Chloride Latest Range: 96-112 mEq/L 98  CO2 Latest Range: 19-32  mEq/L 23  BUN Latest Range: 6-23 mg/dL 14  Creatinine Latest Range: 0.50-1.35 mg/dL 0.86  Calcium Latest Range: 8.4-10.5 mg/dL 8.5  GFR calc non Af Amer Latest Range: >90 mL/min 67 (L)  GFR calc Af Amer Latest Range: >90 mL/min 78 (L)  Glucose Latest Range: 70-99 mg/dL 578 (H)   Recommendations:  May need small amt of meal coverage insulin if CBGs > 180 mg/dL. Add Novolog 3 units tidwc if pt eats >50% meal.  Will follow.

## 2011-12-07 NOTE — Evaluation (Addendum)
Physical Therapy Evaluation Patient Details Name: Peter Bowen. MRN: [id number] DOB: 1940-09-06 Today's Date: 12/07/2011 Time: 1010-1031 PT Time Calculation (min): 21 min  PT Assessment / Plan / Recommendation Clinical Impression  Pt s/p L TKR.  Pt would benefit from acute PT services in order to increase independence with bed mobility, transfers, and ambulation to prepare for d/c to next venue.  Recommend SNF at this time due to increased assist for mobility.    PT Assessment  Patient needs continued PT services    Follow Up Recommendations  Supervision/Assistance - 24 hour;Post acute inpatient    Does the patient have the potential to tolerate intense rehabilitation   No, Recommend SNF  Barriers to Discharge        Equipment Recommendations  Rolling walker with 5" wheels;3 in 1 bedside comode    Recommendations for Other Services     Frequency 7X/week    Precautions / Restrictions Precautions Precautions: Fall;Knee Required Braces or Orthoses: Knee Immobilizer - Left Restrictions Weight Bearing Restrictions: Yes LLE Weight Bearing: Weight bearing as tolerated   Pertinent Vitals/Pain 7-8/10 pain with mobility, repositioned, ice applied BP 154/78 mmHg 77 bpm upon sitting in recliner (lightheaded with ambulation)      Mobility  Bed Mobility Bed Mobility: Supine to Sit Supine to Sit: 4: Min assist;HOB elevated Details for Bed Mobility Assistance: verbal cues for technique, assist required for L LE Transfers Transfers: Stand to Sit;Sit to Stand Sit to Stand: 1: +2 Total assist;From elevated surface;With upper extremity assist;From bed Sit to Stand: Patient Percentage: 60% Stand to Sit: To chair/3-in-1;1: +2 Total assist Stand to Sit: Patient Percentage: 70% Details for Transfer Assistance: verbal cues for safe technique, L LE forward, extension with rising Ambulation/Gait Ambulation/Gait Assistance: 1: +2 Total assist Ambulation/Gait: Patient Percentage:  50% Ambulation Distance (Feet): 10 Feet Assistive device: Rolling walker Ambulation/Gait Assistance Details: max verbal cues for sequence, RW placement, step length, posture Gait Pattern: Step-to pattern;Decreased stance time - left;Trunk flexed;Antalgic;Decreased weight shift to left Gait velocity: decreased  Pt limited by lightheadedness with ambulation.  See BP above.   Shoulder Instructions     Exercises     PT Diagnosis: Difficulty walking;Acute pain  PT Problem List: Decreased strength;Decreased activity tolerance;Decreased mobility;Decreased knowledge of use of DME;Decreased range of motion;Decreased safety awareness;Pain PT Treatment Interventions: DME instruction;Gait training;Functional mobility training;Therapeutic activities;Therapeutic exercise;Patient/family education   PT Goals Acute Rehab PT Goals PT Goal Formulation: With patient Time For Goal Achievement: 12/14/11 Potential to Achieve Goals: Good Pt will go Supine/Side to Sit: with supervision PT Goal: Supine/Side to Sit - Progress: Goal set today Pt will go Sit to Supine/Side: with supervision PT Goal: Sit to Supine/Side - Progress: Goal set today Pt will go Sit to Stand: with supervision PT Goal: Sit to Stand - Progress: Goal set today Pt will go Stand to Sit: with supervision PT Goal: Stand to Sit - Progress: Goal set today Pt will Ambulate: 51 - 150 feet;with supervision;with least restrictive assistive device PT Goal: Ambulate - Progress: Goal set today Pt will Perform Home Exercise Program: with supervision, verbal cues required/provided PT Goal: Perform Home Exercise Program - Progress: Goal set today  Visit Information  Last PT Received On: 12/07/11 Assistance Needed: +2    Subjective Data  Subjective: I was using a crutch or a cane but I had to give the cane back.   Prior Functioning  Home Living Lives With: Spouse;Family Type of Home: House Home Access: Stairs to enter [employer]  of Steps: 4 Entrance Stairs-Rails: Can reach both Home Layout: One level Home Adaptive Equipment: None Prior Function Level of Independence: Independent with assistive device(s) Communication Communication: No difficulties    Cognition  Overall Cognitive Status: Appears within functional limits for tasks assessed/performed Arousal/Alertness: Awake/alert Orientation Level: Appears intact for tasks assessed Behavior During Session: [facility] for tasks performed    Extremity/Trunk Assessment Right Upper Extremity Assessment RUE ROM/Strength/Tone: [facility] for tasks assessed Left Upper Extremity Assessment LUE ROM/Strength/Tone: WFL for tasks assessed Right Lower Extremity Assessment RLE ROM/Strength/Tone: [facility] for tasks assessed Left Lower Extremity Assessment LLE ROM/Strength/Tone: Deficits LLE ROM/Strength/Tone Deficits: poor quad contraction, knee resting in  approx 20* flexion, maintained KI for mobility   Balance    End of Session PT - End of Session Equipment Utilized During Treatment: Left knee immobilizer Activity Tolerance: Patient limited by fatigue;Patient limited by pain Patient left: in chair;with call bell/phone within reach;Other (comment) (with nsg student)  GP     Maida Sale E 12/07/2011, 12:09 PM Pager: [phone]

## 2011-12-07 NOTE — Progress Notes (Signed)
Physical Therapy Treatment Note   12/07/11 1600  PT Visit Information  Last PT Received On 12/07/11  Assistance Needed +2  PT Time Calculation  PT Start Time 1458  PT Stop Time 1529  PT Time Calculation (min) 31 min  Subjective Data  Subjective pt groans loudly with pain during exercises  Precautions  Precautions Fall;Knee  Required Braces or Orthoses Knee Immobilizer - Left  Restrictions  LLE Weight Bearing WBAT  Cognition  Overall Cognitive Status Appears within functional limits for tasks assessed/performed  Bed Mobility  Bed Mobility Supine to Sit;Sit to Supine  Supine to Sit 4: Min assist;HOB elevated  Sit to Supine 4: Min assist;HOB flat  Details for Bed Mobility Assistance verbal cues for technique, assist required for L LE  Transfers  Transfers Stand to Sit;Sit to Stand  Sit to Stand 1: +2 Total assist;From elevated surface;With upper extremity assist;From bed  Sit to Stand: Patient Percentage 70%  Stand to Sit 1: +2 Total assist;To bed;With upper extremity assist  Stand to Sit: Patient Percentage 80%  Details for Transfer Assistance verbal cues for safe technique, L LE forward, extension with rising  Ambulation/Gait  Ambulation/Gait Assistance 1: +2 Total assist  Ambulation/Gait: Patient Percentage 70%  Ambulation Distance (Feet) 34 Feet  Assistive device Rolling walker  Ambulation/Gait Assistance Details continues to require max cues for sequence, RW placement, step length, and posture (increased forward trunk flexion and hip flexion even with cues)  Gait Pattern Step-to pattern;Decreased stance time - left;Trunk flexed;Antalgic;Decreased weight shift to left  Gait velocity decreased  Exercises  Exercises Total Joint  Total Joint Exercises  Ankle Circles/Pumps AROM;20 reps;Both;Limitations  Quad Sets AROM;Left;Limitations  Short Arc [employer];Left  Heel Slides 15 reps;Left;AAROM  Goniometric ROM L knee AAROM -9-25* limited by pain and guarding  Ankle  Circles/Pumps Limitations limited PF observed  Quad Sets Limitations attempted however pt with inconsistent quad muscle contractions so propped pillow under ankle for gravity to assist with knee extension for 3 minutes  PT - End of Session  Equipment Utilized During Treatment Left knee immobilizer  Activity Tolerance Patient limited by fatigue;Patient limited by pain  Patient left in bed;with call bell/phone within reach;with family/visitor present  PT - Assessment/Plan  Comments on Treatment Session Pt ambulated again short distance and performed exercises.  Pt did not tolerate much ROM due to pain and guarding.  Spouse present and reports she will be unable to assist him at home and plan for SNF prior to home.  PT Plan Discharge plan remains appropriate;Frequency remains appropriate  Follow Up Recommendations Post acute inpatient  Does the patient have the potential to tolerate intense rehabilitation? No, Recommend SNF  Equipment Recommended Rolling walker with 5" wheels;3 in 1 bedside comode  Acute Rehab PT Goals  PT Goal: Supine/Side to Sit - Progress Progressing toward goal  PT Goal: Sit to Supine/Side - Progress Progressing toward goal  PT Goal: Sit to Stand - Progress Progressing toward goal  PT Goal: Stand to Sit - Progress Progressing toward goal  PT Goal: Ambulate - Progress Progressing toward goal  PT Goal: Perform Home Exercise Program - Progress Progressing toward goal  PT General Charges  $$ ACUTE PT VISIT 1 Procedure  PT Treatments  $Gait Training 8-22 mins  $Therapeutic Exercise 8-22 mins   Pain: 5/10 at rest L knee and increased with exercises, RN aware and ice applied  Zenovia Jarred, PT Pager: [phone]

## 2011-12-07 NOTE — Progress Notes (Signed)
Subjective: Doing Well .Hemovac Dcd.Temp.   Objective: Vital signs in last 24 hours: Temp:  [97.6 F (36.4 C)-102 F (38.9 C)] 101 F (38.3 C) (10/17 0700) Pulse Rate:  [55-81] 81  (10/17 0549) Resp:  [14-18] 16  (10/17 0549) BP: (126-167)/(65-98) 156/75 mmHg (10/17 0549) SpO2:  [95 %-100 %] 99 % (10/17 0549) Weight:  [112.492 kg (248 lb)] 112.492 kg (248 lb) (10/16 1600)  Intake/Output from previous day: 10/16 0701 - 10/17 0700 In: 4886.7 [P.O.:720; I.V.:4166.7] Out: 2895 [Urine:2125; Drains:570; Blood:200] Intake/Output this shift:     Basename 12/07/11 0345  HGB 9.8*    Basename 12/07/11 0345  WBC 6.8  RBC 2.98*  HCT 28.4*  PLT 179    Basename 12/07/11 0345  NA 131*  K 4.1  CL 98  CO2 23  BUN 14  CREATININE 1.09  GLUCOSE 229*  CALCIUM 8.5   No results found for this basename: LABPT:2,INR:2 in the last 72 hours  Dorsiflexion/Plantar flexion intact  Assessment/Plan: Will monitor Temp.   Elizabethanne Lusher A 12/07/2011, 7:33 AM

## 2011-12-07 NOTE — Op Note (Signed)
NAME:  Peter Bowen, Peter Bowen NO.:  [id number]  MEDICAL RECORD NO.:  [id number]  LOCATION:  1605                         FACILITY:  [facility]  PHYSICIAN:  Georges Lynch. Mathew Storck, M.D.DATE OF BIRTH:  01/10/1941  DATE OF PROCEDURE:  12/06/2011 DATE OF DISCHARGE:                              OPERATIVE REPORT   SURGEON:  Georges Lynch. Darrelyn Hillock, MD  ASSISTANT:  Dimitri Ped, PA  PREOPERATIVE DIAGNOSES: 1. 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 2. SEVERE flexion contracture, left knee.  POSTOPERATIVE DIAGNOSES: 1. 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 2. SEVERE flexion contracture, left knee.  OPERATION:  Total knee arthroplasty on the left utilizing DePuy system. The size of the femur was a size 6.  Tibial tray size 5.  The insert was a size 6, 10-mm thickness insert.  The patella was a size 41 with Three- Peg.  PROCEDURE:  Under general anesthesia, routine orthopedic prep and draping the left lower extremity was carried out.  He had 2 g of IV Ancef.  At this time, the appropriate time-out was carried out prior to making the incision.  I also marked the appropriate left leg in the holding area.  At this time, the left leg was placed in a DeMayo leg holder and the leg was exsanguinated with an Esmarch.  The tourniquet was elevated at 325 mmHg.  The knee was flexed .  I carried out an anterior approach and I created 2 flaps.  Then did a median parapatellar incision reflecting the patella laterally.  At this particular point, the drill hole was made in the intercondylar notch in usual fashion.  I removed 12-mm thickness off the distal femur.  Note, the knee was extremely tight.  He had a large fragment off the medial plateau with a severe medial varus type contraction.  I did have to do a good soft tissue relieve.  I removed a portion of this bone from the lateral plateau.  I had to relieve another small portion there because it was involved with  the medial collateral ligament.  I did not want to destabilize the knee.  Note, his knee has been extremely tight and like that for quite some time.  He had a severe varus preop as well.  At this particular time, I then did measure the femur to be a size 6.  We did our anterior, posterior, and chamfer cuts for a size 6 left femur.  The tibia that was prepared in usual fashion.  I utilized the intramedullary guide.  I removed 8-mm thickness off the affected medial side of the tibia.  Note, I had to go back several times and extend my medial release because the extreme tightness of the knee and large spurs medially.  Great care was taken not to injure lying in neurovascular structures.  At this point after we were happy with our tibial cut, we then utilized our spacer blocks.  We checked the tension in flexion and extension and medial lateral stability.  After that we then continued to prepare the tibia, we cut our keel cut of the tibia for the usual fashion.  We selected a size 5 tibial tray.  I then cut my  notch, cut out of the distal femur in the usual fashion.  We then inserted our trial components.  We reduced the knee still a little tight in extension, but his soft tissues were extremely contracted.  We did back and make another cut in order to help alleviate this tightness in extension but after that was all completed, we did our resurfacing procedure on the patella in the usual fashion.  Three drill holes were made in the patella for a size 41 patella.  All trial components were removed.  We thoroughly water picked out the knee, cemented all 3 components in simultaneously.  At this point, we had good flexion, good extension, good medial lateral stability.  I then removed the trial tibial component.  I injected a mixture of 20 mL of Exparel and 20 mL of normal saline.  I injected part of that beginning of procedure, part at the end.  After that as I mentioned prior to that, we  thoroughly flushed out the knee, to make sure there was no loose piece cement.  I then inserted my permanent size 5, 10 mm thickness rotating platform.  We had a nice reduction and good stability.  I then inserted my Hemovac drain. We did let the tourniquet down  prior to closing the knee.  The knee then was closed in layers in usual fashion.  The skin was closed with a subcu closure and sterile dressings were applied.          ______________________________ Georges Lynch Darrelyn Hillock, M.D.     RAG/MEDQ  D:  12/06/2011  T:  12/07/2011  Job:  [id number]

## 2011-12-08 LAB — GLUCOSE, CAPILLARY
Glucose-Capillary: 174 mg/dL — ABNORMAL HIGH (ref 70–99)
Glucose-Capillary: 173 mg/dL — ABNORMAL HIGH (ref 70–99)
Glucose-Capillary: 200 mg/dL — ABNORMAL HIGH (ref 70–99)

## 2011-12-08 LAB — CBC
MCH: 32.2 pg (ref 26.0–34.0)
MCV: 95.2 fL (ref 78.0–100.0)
Platelets: 150 10*3/uL (ref 150–400)
RDW: 12.5 % (ref 11.5–15.5)

## 2011-12-08 LAB — URINALYSIS, ROUTINE W REFLEX MICROSCOPIC
Bilirubin Urine: NEGATIVE
Nitrite: POSITIVE — AB
Protein, ur: 100 mg/dL — AB
Urobilinogen, UA: 1 mg/dL (ref 0.0–1.0)
pH: 5.5 (ref 5.0–8.0)

## 2011-12-08 LAB — BASIC METABOLIC PANEL
Calcium: 8.5 mg/dL (ref 8.4–10.5)
Creatinine, Ser: 1.27 mg/dL (ref 0.50–1.35)
GFR calc Af Amer: 65 mL/min — ABNORMAL LOW (ref >90)
GFR calc non Af Amer: 56 mL/min — ABNORMAL LOW (ref >90)
Sodium: 130 mEq/L — ABNORMAL LOW (ref 135–145)

## 2011-12-08 LAB — URINE MICROSCOPIC-ADD ON

## 2011-12-08 MED ORDER — METHOCARBAMOL 500 MG PO TABS: 500.0000 mg | ORAL_TABLET | Freq: Four times a day (QID) | ORAL | Status: DC | PRN

## 2011-12-08 MED ORDER — FERROUS SULFATE 325 (65 FE) MG PO TABS: 325.0000 mg | ORAL_TABLET | Freq: Three times a day (TID) | ORAL | Status: DC

## 2011-12-08 MED ORDER — RIVAROXABAN 10 MG PO TABS: 10.0000 mg | ORAL_TABLET | Freq: Every day | ORAL | Status: DC

## 2011-12-08 MED ORDER — POLYETHYLENE GLYCOL 3350 17 G PO PACK: 17.0000 g | PACK | Freq: Every day | ORAL | Status: DC | PRN

## 2011-12-08 MED ORDER — OXYCODONE-ACETAMINOPHEN 5-325 MG PO TABS: 1.0000 | ORAL_TABLET | ORAL | Status: DC | PRN

## 2011-12-08 NOTE — Progress Notes (Signed)
Physical Therapy Treatment Patient Details Name: Peter Bowen. MRN: [id number] DOB: [birth year] Today's Date: 12/08/2011 Time: 4098-1191 PT Time Calculation (min): 24 min  PT Assessment / Plan / Recommendation Comments on Treatment Session  Pt ambulated and performed exercises.  Pt appears more lethargic today and required increased assist.  Mobility and exercises limited by pain.    Follow Up Recommendations  Post acute inpatient     Does the patient have the potential to tolerate intense rehabilitation  No, Recommend SNF  Barriers to Discharge        Equipment Recommendations  Rolling walker with 5" wheels;3 in 1 bedside comode    Recommendations for Other Services    Frequency     Plan Discharge plan remains appropriate;Frequency remains appropriate    Precautions / Restrictions Precautions Precautions: Fall;Knee Required Braces or Orthoses: Knee Immobilizer - Left Restrictions LLE Weight Bearing: Weight bearing as tolerated   Pertinent Vitals/Pain Pt did not rate pain but reports increased pain and groans in pain with WBing and exercises.  Encouraged pt to take pain meds to tolerate therapy in future.  Ice pack applied.    Mobility  Bed Mobility Supine to Sit: 4: Min assist Transfers Transfers: Stand to Sit;Sit to Stand Sit to Stand: 1: +2 Total assist;From chair/3-in-1;With upper extremity assist Sit to Stand: Patient Percentage: 60% Stand to Sit: 1: +2 Total assist;With upper extremity assist;To chair/3-in-1 Stand to Sit: Patient Percentage: 60% Details for Transfer Assistance: +2 increased assist from recliner surface, increased verbal cues for safe technique Ambulation/Gait Ambulation/Gait Assistance: 1: +2 Total assist Ambulation/Gait: Patient Percentage: 70% Ambulation Distance (Feet): 15 Feet Assistive device: Rolling walker Ambulation/Gait Assistance Details: pt reports, "not yet sugar" when encouraged and cued to perform trunk/hip extension,  increased verbal and visual cues for RW placement and step length Gait Pattern: Step-to pattern;Decreased stance time - left;Trunk flexed;Antalgic;Decreased weight shift to left Gait velocity: decreased General Gait Details: pt reported dizziness and increased pain so recliner brought behind pt, pt felt better once reclined    Exercises Total Joint Exercises Ankle Circles/Pumps: AROM;20 reps;Both;Limitations Ankle Circles/Pumps Limitations: limited PF observed Quad Sets: AROM;Left;Limitations Quad Sets Limitations: pt still unable to perform quad set (?ability vs comprehension of exercise) so propped ankle on pillow for 3 minutes Short Arc Quad: AAROM;15 reps;Left Heel Slides: 15 reps;Left;AAROM Goniometric ROM: L knee AAROM -10-25* limited by pain and strong guarding   PT Diagnosis:    PT Problem List:   PT Treatment Interventions:     PT Goals Acute Rehab PT Goals PT Goal: Sit to Stand - Progress: Not progressing PT Goal: Stand to Sit - Progress: Not progressing PT Goal: Ambulate - Progress: Not progressing PT Goal: Perform Home Exercise Program - Progress: Progressing toward goal  Visit Information  Last PT Received On: 12/08/11 Assistance Needed: +2    Subjective Data  Subjective: pt lethargic today   Cognition  Overall Cognitive Status: Impaired Area of Impairment: Safety/judgement Arousal/Alertness: Lethargic Orientation Level: Appears intact for tasks assessed Behavior During Session: [facility] for tasks performed Safety/Judgement: Decreased awareness of safety precautions;Decreased safety judgement for tasks assessed;Impulsive Cognition - Other Comments: Answers didn't seem complete when asking about help at home.  Wife there but volunteers; ?granddaughter who is going to school.  Pt has decreased insight: states, I can put 3:1 in my shower myself.  You'd be surprised at what I can do    Balance     End of Session PT - End of Session Equipment Utilized During  Treatment:  Left knee immobilizer Activity Tolerance: Patient limited by fatigue;Patient limited by pain Patient left: in chair;with call bell/phone within reach;with chair alarm set   GP     Djon Tith,KATHrine E 12/08/2011, 12:19 PM Pager: [phone]

## 2011-12-08 NOTE — Care Management Note (Signed)
    Page 1 of 2   12/12/2011     3:55:36 PM   CARE MANAGEMENT NOTE 12/12/2011  Patient:  Peter Bowen, Peter Bowen   Account Number:  [id number]  Date Initiated:  12/08/2011  Documentation initiated by:  Colleen Can  Subjective/Objective Assessment:   dx osteoarthritis left knee; total knee replacemnt     Action/Plan:   CM attempted to speak with patient. Pt very sleepy, unable to conversate. Family not in Rm. PT is recommending SNF. Left msg via telephone for pt's spouse. Referral made to csw   Anticipated DC Date:  12/11/2011   Anticipated DC Plan:  SKILLED NURSING FACILITY  In-house referral  Clinical Social Worker      DC Associate Professor  CM consult      PAC Choice  DURABLE MEDICAL EQUIPMENT  HOME HEALTH   Choice offered to / List presented to:  C-3 Spouse   DME arranged  3-N-1  Levan Hurst      DME agency  [facility]     HH arranged  HH-1 RN  HH-2 PT  HH-3 OT      [facility] agency  [facility]   Status of service:  Completed, signed off Medicare Important Message given?  NO (If response is "NO", the following Medicare IM given date fields will be blank) Date Medicare IM given:   Date Additional Medicare IM given:    Discharge Disposition:  HOME W HOME HEALTH SERVICES  Per UR Regulation:  Reviewed for med. necessity/level of care/duration of stay  If discussed at Long Length of Stay Meetings, dates discussed:   12/12/2011    Comments:  12/12/2011 Raynelle Bring BSN CCM [phone] Plans have changed. Pt is requesting to go home and not go to SNF rehab. Spouse who is caregiver states she is willing to take patient home and feels she can manage patient. Spouse present for patient physical therapy session. DME -RW AND 3N1 DELIVERED TO PATIENT'S RM. [facility] WILL PROVIDE HH SERVICES WITH START DATE OF TOMORROW 0/23/2013. Contact information for [facility] agency given to pt's spouse.  12/11/2011 Raynelle Bring BSN CCM  [phone] Plans are for SNF when ready for discharge. developed fever of 102.9, pt with hgb/hct 7.4/21.5-to receive 2 units of blood  today.  12/08/2011 Raynelle Bring BSN CCM [phone] Spoke with patient's spouse regarding discharge planning.States she will not be able to safely care for spouse at home. Is in agrement with SNF rehab. Advised that CSW give her further information regarding SNF rehab. Referral has been made to CSW. CM signing off.

## 2011-12-08 NOTE — Evaluation (Signed)
Occupational Therapy Evaluation Patient Details Name: Peter Bowen. MRN: [id number] DOB: 1940/11/06 Today's Date: 12/08/2011 Time: 0347-4259 OT Time Calculation (min): 20 min  OT Assessment / Plan / Recommendation Clinical Impression  This 71 year old man was admitted for L TKA.  He is independent at baseline and wishes to be independent ASAP.  Pt's impulsivity, decreased safety are concerns.  He will benefit from skilled OT to increase independence with adls with min A level goals in acute.    OT Assessment  Patient needs continued OT Services    Follow Up Recommendations  Skilled nursing facility (if home, needs 24/7)    Barriers to Discharge      Equipment Recommendations  Rolling walker with 5" wheels;3 in 1 bedside comode    Recommendations for Other Services    Frequency  Min 2X/week    Precautions / Restrictions Precautions Precautions: Fall;Knee Restrictions Weight Bearing Restrictions: Yes LLE Weight Bearing: Weight bearing as tolerated   Pertinent Vitals/Pain L knee after transfer--did not rate but said he could use pain meds.  Called RN and NT applied ice    ADL  Grooming: Simulated;Set up Where Assessed - Grooming: Supported sitting Upper Body Bathing: Simulated;Set up Where Assessed - Upper Body Bathing: Supported sitting Lower Body Bathing: Simulated;Moderate assistance Where Assessed - Lower Body Bathing: Supported sit to stand Upper Body Dressing: Simulated;Set up Where Assessed - Upper Body Dressing: Supported sitting Lower Body Dressing: Simulated;Moderate assistance Where Assessed - Lower Body Dressing: Supported sit to Pharmacist, hospital: Simulated;Moderate assistance Toilet Transfer Method: Stand pivot Acupuncturist:  (bed to chair) Toileting - Clothing Manipulation and Hygiene: Simulated;Minimal assistance Where Assessed - Toileting Clothing Manipulation and Hygiene: Sit to stand from 3-in-1 or toilet Transfers/Ambulation  Related to ADLs: Pt impulsive; needed max cues and sometimes moved too quickly to follow ADL Comments: Pt had washed himself prior to OTs arrival--did what he could.  Very independent:  does not want help    OT Diagnosis: Generalized weakness  OT Problem List: Decreased strength;Decreased range of motion;Impaired balance (sitting and/or standing);Decreased safety awareness;Decreased knowledge of use of DME or AE;Decreased knowledge of precautions OT Treatment Interventions: Self-care/ADL training;DME and/or AE instruction;Therapeutic activities;Cognitive remediation/compensation;Patient/family education   OT Goals Acute Rehab OT Goals OT Goal Formulation: With patient Time For Goal Achievement: 12/15/11 Potential to Achieve Goals: Good ADL Goals Pt Will Transfer to Toilet: with min assist;Ambulation;3-in-1 ADL Goal: Toilet Transfer - Progress: Goal set today Pt Will Perform Toileting - Hygiene: with min assist;Sit to stand from 3-in-1/toilet (min guard) ADL Goal: Toileting - Hygiene - Progress: Goal set today Pt Will Perform Tub/Shower Transfer: Shower transfer;with min assist;Ambulation ADL Goal: Web designer - Progress: Goal set today Miscellaneous OT Goals Miscellaneous OT Goal #1: Pt will demonstrate or verbalize use of AE for adls OT Goal: Miscellaneous Goal #1 - Progress: Goal set today  Visit Information  Last OT Received On: 12/08/11 Assistance Needed: +2 (+1 SPT)    Subjective Data  Subjective: My granddaughter is there with me Patient Stated Goal: agreeable to OT   Prior Functioning     Home Living Lives With: Spouse;Family Bathroom Shower/Tub: Health visitor: Standard Prior Function Level of Independence: Independent with assistive device(s) Communication Communication: No difficulties         Vision/Perception     Cognition  Overall Cognitive Status: Impaired Area of Impairment: Safety/judgement Arousal/Alertness:  Awake/alert Orientation Level: Appears intact for tasks assessed Behavior During Session: [facility] for tasks performed Safety/Judgement: Decreased  awareness of safety precautions;Decreased safety judgement for tasks assessed;Impulsive Cognition - Other Comments: Answers didn't seem complete when asking about help at home.  Wife there but volunteers; ?granddaughter who is going to school.  Pt has decreased insight: states, I can put 3:1 in my shower myself.  You'd be surprised at what I can do    Extremity/Trunk Assessment Right Upper Extremity Assessment RUE ROM/Strength/Tone: [facility] for tasks assessed Left Upper Extremity Assessment LUE ROM/Strength/Tone: WFL for tasks assessed     Mobility Bed Mobility Supine to Sit: 4: Min assist Transfers Sit to Stand: 4: Min assist Details for Transfer Assistance: max vcs for safety     Shoulder Instructions     Exercise     Balance     End of Session OT - End of Session Equipment Utilized During Treatment: Left lower extremity prosthesis (rw) Activity Tolerance: Patient tolerated treatment well Patient left: in chair;with call bell/phone within reach Nurse Communication: Mobility status (impulsivity)  GO     Darshan Solanki 12/08/2011, 10:24 AM Marica Otter, OTR/L [phone] 12/08/2011

## 2011-12-08 NOTE — Progress Notes (Signed)
Subjective: Temp. 99.7. Dressing Changed and wound looks fine. Plan on DC Saturday.   Objective: Vital signs in last 24 hours: Temp:  [97.4 F (36.3 C)-102.5 F (39.2 C)] 99.7 F (37.6 C) (10/18 0454) Pulse Rate:  [80-94] 92  (10/18 0454) Resp:  [16-20] 16  (10/18 0454) BP: (125-160)/(71-78) 143/76 mmHg (10/18 0454) SpO2:  [92 %-100 %] 93 % (10/18 0454)  Intake/Output from previous day: 10/17 0701 - 10/18 0700 In: 3088.3 [P.O.:900; I.V.:2188.3] Out: 2030 [Urine:2030] Intake/Output this shift: Total I/O In: 1180 [P.O.:180; I.V.:1000] Out: 750 [Urine:750]   Basename 12/08/11 0340 12/07/11 0345  HGB 8.8* 9.8*    Basename 12/08/11 0340 12/07/11 0345  WBC 11.3* 6.8  RBC 2.73* 2.98*  HCT 26.0* 28.4*  PLT 150 179    Basename 12/08/11 0340 12/07/11 0345  NA 130* 131*  K 3.8 4.1  CL 97 98  CO2 24 23  BUN 12 14  CREATININE 1.27 1.09  GLUCOSE 224* 229*  CALCIUM 8.5 8.5   No results found for this basename: LABPT:2,INR:2 in the last 72 hours  Dorsiflexion/Plantar flexion intact No cellulitis present  Assessment/Plan: DC Saturday.   Ardith Lewman A 12/08/2011, 6:35 AM

## 2011-12-09 DIAGNOSIS — E871 Hypo-osmolality and hyponatremia: Secondary | ICD-10-CM | POA: Diagnosis present

## 2011-12-09 DIAGNOSIS — G9340 Encephalopathy, unspecified: Secondary | ICD-10-CM | POA: Diagnosis not present

## 2011-12-09 DIAGNOSIS — N390 Urinary tract infection, site not specified: Secondary | ICD-10-CM | POA: Diagnosis not present

## 2011-12-09 DIAGNOSIS — Z96659 Presence of unspecified artificial knee joint: Secondary | ICD-10-CM

## 2011-12-09 LAB — BASIC METABOLIC PANEL
Calcium: 8.6 mg/dL (ref 8.4–10.5)
GFR calc Af Amer: 57 mL/min — ABNORMAL LOW (ref >90)
GFR calc non Af Amer: 49 mL/min — ABNORMAL LOW (ref >90)
Glucose, Bld: 218 mg/dL — ABNORMAL HIGH (ref 70–99)
Potassium: 3.3 mEq/L — ABNORMAL LOW (ref 3.5–5.1)
Sodium: 131 mEq/L — ABNORMAL LOW (ref 135–145)

## 2011-12-09 LAB — CBC
HCT: 23.5 % — ABNORMAL LOW (ref 39.0–52.0)
Hemoglobin: 8 g/dL — ABNORMAL LOW (ref 13.0–17.0)
MCH: 32.3 pg (ref 26.0–34.0)
MCHC: 34 g/dL (ref 30.0–36.0)
MCV: 94.8 fL (ref 78.0–100.0)
RBC: 2.48 MIL/uL — ABNORMAL LOW (ref 4.22–5.81)

## 2011-12-09 LAB — GLUCOSE, CAPILLARY
Glucose-Capillary: 204 mg/dL — ABNORMAL HIGH (ref 70–99)
Glucose-Capillary: 172 mg/dL — ABNORMAL HIGH (ref 70–99)

## 2011-12-09 MED ORDER — DEXTROSE 5 % IV SOLN
1.0000 g | Freq: Once | INTRAVENOUS | Status: AC
  Administered 2011-12-09: 1 g via INTRAVENOUS
  Filled 2011-12-09: qty 10

## 2011-12-09 MED ORDER — NALOXONE HCL 0.4 MG/ML IJ SOLN
INTRAMUSCULAR | Status: AC
  Administered 2011-12-09: 09:00:00
  Filled 2011-12-09: qty 1

## 2011-12-09 MED ORDER — SODIUM CHLORIDE 0.9 % IV SOLN
INTRAVENOUS | Status: DC
  Administered 2011-12-09 – 2011-12-10 (×2): via INTRAVENOUS

## 2011-12-09 MED ORDER — NALOXONE HCL 0.4 MG/ML IJ SOLN
0.4000 mg | INTRAMUSCULAR | Status: DC | PRN
  Administered 2011-12-09: 0.4 mg via INTRAVENOUS

## 2011-12-09 NOTE — Consult Note (Signed)
Hospital Admission Note Date: 12/09/2011  Patient name: Peter Bowen. Medical record number: [id number] Date of birth: [birth year] Age: 71 y.o. Gender: male PCP: No primary provider on file.  Attending physician: Jacki Cones, MD  Reason for Consult: Change in mental status  History of Present Illness: This 72 year old gentleman who underwent a total knee arthroplasty 48 hours ago. The patient was doing well and his clinical course and was scheduled to be discharged this morning. According to the orthopedic service PA when she arrived to the patient she noted the patient was having some alteration in mental status. The internal medicine consultation was placed  for evaluation and further management.  Upon my arrival to see the patient he had already received a dose of Narcan for narcotic reversal. The patient commented that he felt as though he were "high". He specifically asked that that pain medication not be administered to him any further. The patient at the time of my arrival was alert oriented x3. He did however have some grogginess in his appearance and he admitted that he felt groggy. The patient's wife spoke with him today and did not feel that there was any change in his mental status from normal.  The patient denies any headaches, nausea, vomiting, diarrhea, chest pain, shortness of breath, cough, abdominal pain, dysuria but the nurse notes that the patient has been exhibiting frequency in the last 24 hours.  Scheduled Meds:   . amLODipine  10 mg Oral Daily   And  . benazepril  20 mg Oral Daily  . cefTRIAXone (ROCEPHIN)  IV  1 g Intravenous Once  . ferrous sulfate  325 mg Oral TID PC  . glimepiride  4 mg Oral BID AC  . insulin aspart  0-15 Units Subcutaneous TID WC  . linagliptin  5 mg Oral Daily  . metFORMIN  1,000 mg Oral BID WC  . metoprolol succinate  50 mg Oral Daily  . naloxone      . rivaroxaban  10 mg Oral Q breakfast   Continuous Infusions:   . sodium  chloride 75 mL/hr at 12/09/11 1233  . DISCONTD: lactated ringers 20 mL/hr at 12/08/11 1500   PRN Meds:.acetaminophen, acetaminophen, alum & mag hydroxide-simeth, bisacodyl, HYDROcodone-acetaminophen, HYDROmorphone (DILAUDID) injection, menthol-cetylpyridinium, methocarbamol (ROBAXIN) IV, methocarbamol, naloxone (NARCAN) injection, ondansetron (ZOFRAN) IV, ondansetron, oxyCODONE-acetaminophen, phenol, polyethylene glycol, tiZANidine Allergies: Review of patient's allergies indicates no known allergies. Past Medical History  Diagnosis Date  . Diabetes mellitus without complication   . Neuromuscular disorder     parathesias bilateral feet  . Hypertension   . Edema of both legs   . Hyperlipidemia   . Rhinitis, allergic   . Peripheral vascular disease     lower edema atherosclerosis  . Arthritis   . Chronic kidney disease     renal insufficiency per PCP note 11/17/11  . Cough    Past Surgical History  Procedure Date  . Colonoscopy   . Bvt     bilateral ears as a baby  . 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 No pertinent family history. History   Social History  . Marital Status: Married    Spouse Name: N/A    Number of Children: N/A  . Years of Education: N/A   Occupational History  . Not on file.   Social History Main Topics  . Smoking  status: Current Every Day Smoker -- 1.0 packs/day    Types: Cigarettes  . Smokeless tobacco: Never Used  . Alcohol Use: Yes     socially  . Drug Use: No  . Sexually Active: Not on file   Other Topics Concern  . Not on file   Social History Narrative  . No narrative on file   Review of Systems: A comprehensive review of systems was negative except as noted in history of present illness. Physical Exam:  Intake/Output Summary (Last 24 hours) at 12/09/11 1803 Last data filed at 12/09/11  1233  Gross per 24 hour  Intake 1021.25 ml  Output   1075 ml  Net -53.75 ml   General: Alert, awake, oriented x3, in no acute distress.  HEENT: [place]/AT PEERL, EOMI Neck: Trachea midline,  no masses, no thyromegal,y no JVD, no carotid bruit OROPHARYNX:  Moist, No exudate/ erythema/lesions.  Heart: Regular rate and rhythm, without murmurs, rubs, gallops, PMI non-displaced, no heaves or thrills on palpation.  Lungs: Clear to auscultation, no wheezing or rhonchi noted. No increased vocal fremitus resonant to percussion  Abdomen: Soft, nontender, nondistended, positive bowel sounds, no masses no hepatosplenomegaly noted..  Neuro: No focal neurological deficits noted cranial nerves II through XII grossly intact. DTRs 2+ bilaterally upper and lower extremities. Strength 5 out of 5 in bilateral upper and lower extremities. Musculoskeletal: No warm swelling or erythema around joints, no spinal tenderness noted. Psychiatric: Patient alert and oriented x3, good insight and cognition, good recent to remote recall. Lymph node survey: No cervical axillary or inguinal lymphadenopathy noted.  Lab results:  Basename 12/09/11 0953 12/08/11 0340  NA 131* 130*  K 3.3* 3.8  CL 95* 97  CO2 24 24  GLUCOSE 218* 224*  BUN 18 12  CREATININE 1.42* 1.27  CALCIUM 8.6 8.5  MG -- --  PHOS -- --   No results found for this basename: AST:2,ALT:2,ALKPHOS:2,BILITOT:2,PROT:2,ALBUMIN:2 in the last 72 hours No results found for this basename: LIPASE:2,AMYLASE:2 in the last 72 hours  Basename 12/09/11 0553 12/08/11 0340  WBC 12.6* 11.3*  NEUTROABS -- --  HGB 8.0* 8.8*  HCT 23.5* 26.0*  MCV 94.8 95.2  PLT 143* 150   Imaging results:  Dg Chest 2 View  12/01/2011  *RADIOLOGY REPORT*  Clinical Data: Preop respiratory exam.  Diabetes and hypertension.  CHEST - 2 VIEW  Comparison: None.  Findings: Mild cardiomegaly is seen as well as ectasia of the thoracic aorta.  Both lungs are clear.  No evidence of pleural  effusion.  No mass or lymphadenopathy identified.  IMPRESSION: Mild cardiomegaly.  No active lung disease.   Original Report Authenticated By: Danae Orleans, M.D.    Dg Knee Left Port  12/06/2011  *RADIOLOGY REPORT*  Clinical Data: Post left total knee replacement  PORTABLE LEFT KNEE - 1-2 VIEW  Comparison: Portable exam 1448 hours without priors for comparison.  Findings: Components of left knee prosthesis in expected positions. No fracture, dislocation, or bone destruction. Scattered bedding and external artifacts. Surgical drain anteriorly extending to the lateral gutter. Expected soft tissue changes. No periprosthetic lucency. Minimal atherosclerotic calcifications superficial femoral artery.  IMPRESSION: Left knee prosthesis without acute complication.   Original Report Authenticated By: Lollie Marrow, M.D.    Other results:    Patient Active Hospital Problem List:  Ateration in mental status (12/09/2011)    Assessment: The patient had reported alteration in mental status. However at the time of my evaluation the patient appeared to be alert  and oriented x3 and had more of what I would describe as a change in his sensorium likely secondary to narcotics in a setting of a possible urinary tract infection.   1. The patient has already received a dose of Narcan. As he is able to answer question and is mentally alert I do not feel that there is any role for further narcotic reversal the care of this patient at this time.   2. The urinalysis of this patient this does suggest that he may be having a urinary tract infection. I requested that the urine sent for culture the patient has been started on Rocephin.  3. If the patient continues to have persistent fever despite the treatment of the urinary tract infection I would pursue a chest x-ray as this is a common postoperative complication within a few days of surgery.   4. the patient exhibits no focal neurological deficits thus I do not believe  that a CT of the head would be of high yield in this patient. I would advise against it at this time.  5. I will treat the patient's fever with Tylenol as the fever in itself could lead to alteration in mental status.     Thank you for inviting [place] to be involved in the care of this patient. We will be happy to follow along in his care to the resolution of this complaint  Advit Trethewey A. 12/09/2011, 6:03 PM

## 2011-12-09 NOTE — Progress Notes (Signed)
Peter Bowen.  MRN: [id number] DOB/Age: 70-Feb-1942 70 y.o. Physician: Jacquelyne Balint Procedure: Procedure(s) (LRB): TOTAL KNEE ARTHROPLASTY (Left)     Subjective: Pt very sedate and difficult to keep awake for exam this am. Nursing and myself have been with the patient greater than 15-20 minutes and he is just now barely able to open his eyes. Answers questions but takes a few responses to find correct answer.  By review he had a total of 5 Norco yesterday and 2 doses of robaxin but nothing since 5 pm yesterday. He has received tylenol only overnight because of his fevers. OT and case management document changes in his mental alertness in last few notes  Vital Signs Temp:  [99.8 F (37.7 C)-102.5 F (39.2 C)] 100.2 F (37.9 C) (10/19 0443) Pulse Rate:  [89-102] 102  (10/19 0443) Resp:  [16-18] 16  (10/19 0443) BP: (128-189)/(71-76) 189/76 mmHg (10/19 0443) SpO2:  [90 %-96 %] 93 % (10/19 0443)  Lab Results  Basename 12/09/11 0553 12/08/11 0340  WBC 12.6* 11.3*  HGB 8.0* 8.8*  HCT 23.5* 26.0*  PLT 143* 150   BMET  Basename 12/08/11 0340 12/07/11 0345  NA 130* 131*  K 3.8 4.1  CL 97 98  CO2 24 23  GLUCOSE 224* 229*  BUN 12 14  CREATININE 1.27 1.09  CALCIUM 8.5 8.5   INR  Date Value Range Status  12/01/2011 1.02  0.00 - 1.49 Final     Exam Opens both eyes symmetric. Beginning to become a little more alert throughout exam Moves arms hand and squeezes symmetric and move lower extremities well without sign of weakness. Left knee dressing dry no drainage        Plan Restart IVF Give dose of narcan I have spoken with Dr. Ashley Royalty who will see patient He looks to have an active UTI Will send urine for culture and give IV Rocephin Hold DC until improves   [name] for Dr.Kevin Supple 12/09/2011, 8:53 AM

## 2011-12-09 NOTE — Progress Notes (Signed)
Physical Therapy Treatment Patient Details Name: Peter Bowen. MRN: [id number] DOB: 23-Apr-1940 Today's Date: 12/09/2011 Time: 4098-1191 PT Time Calculation (min): 11 min  PT Assessment / Plan / Recommendation Comments on Treatment Session  Pt still having difficulty with quad sests. Pt is also still confused somewhat. Continue to recommend SNF.     Follow Up Recommendations  Post acute inpatient     Does the patient have the potential to tolerate intense rehabilitation  No, Recommend SNF  Barriers to Discharge        Equipment Recommendations  Rolling walker with 5" wheels;3 in 1 bedside comode    Recommendations for Other Services    Frequency 7X/week   Plan Discharge plan remains appropriate    Precautions / Restrictions Precautions Precautions: Knee;Fall Required Braces or Orthoses: Knee Immobilizer - Left Knee Immobilizer - Left: Discontinue once straight leg raise with < 10 degree lag Restrictions Weight Bearing Restrictions: No LLE Weight Bearing: Weight bearing as tolerated   Pertinent Vitals/Pain L knee-unrated    Mobility    Exercises Total Joint Exercises Ankle Circles/Pumps: AROM;Both;15 reps;Supine Quad Sets Limitations: Pt unable to grasp concept of quad sets.  Short Arc QuadBarbaraann Boys;Left;10 reps;Supine Heel Slides: AAROM;Left;10 reps;Supine Hip ABduction/ADduction: AAROM;Left;10 reps;Supine Straight Leg Raises: AAROM;Left;10 reps;Supine   PT Diagnosis:    PT Problem List:   PT Treatment Interventions:     PT Goals Acute Rehab PT Goals Pt will Perform Home Exercise Program: with supervision, verbal cues required/provided PT Goal: Perform Home Exercise Program - Progress: Progressing toward goal  Visit Information  Last PT Received On: 12/09/11 Assistance Needed: +1    Subjective Data  Subjective: "I need to get on the exercise bike" Patient Stated Goal: Home   Cognition  Overall Cognitive Status: Impaired Area of Impairment:  Safety/judgement Arousal/Alertness: Awake/alert Orientation Level: Appears intact for tasks assessed Behavior During Session: [facility] for tasks performed Safety/Judgement: Decreased awareness of safety precautions;Decreased safety judgement for tasks assessed;Impulsive    Balance     End of Session PT - End of Session Equipment Utilized During Treatment: Left knee immobilizer Activity Tolerance: Patient tolerated treatment well Patient left: in bed;with call bell/phone within reach;with bed alarm set   GP     Rebeca Alert [facility] 12/09/2011, 4:13 PM [phone]

## 2011-12-09 NOTE — Progress Notes (Signed)
Physical Therapy Treatment Patient Details Name: Peter Bowen. MRN: [id number] DOB: [birth year] Today's Date: 12/09/2011 Time: 1020-1040 PT Time Calculation (min): 20 min  PT Assessment / Plan / Recommendation Comments on Treatment Session  Narcan given this a.m. per RN. Pt more alert this session but still limited by pain, fatigue. Declined to ambulate in hallway.     Follow Up Recommendations  Post acute inpatient     Does the patient have the potential to tolerate intense rehabilitation  No, Recommend SNF  Barriers to Discharge        Equipment Recommendations  Rolling walker with 5" wheels;3 in 1 bedside comode    Recommendations for Other Services    Frequency 7X/week   Plan Discharge plan remains appropriate    Precautions / Restrictions Precautions Precautions: Knee;Fall Required Braces or Orthoses: Knee Immobilizer - Left Knee Immobilizer - Left: Discontinue once straight leg raise with < 10 degree lag Restrictions Weight Bearing Restrictions: No LLE Weight Bearing: Weight bearing as tolerated   Pertinent Vitals/Pain     Mobility  Bed Mobility Bed Mobility: Supine to Sit Supine to Sit: 4: Min assist Details for Bed Mobility Assistance: Assist for L LE off bed Transfers Transfers: Sit to Stand;Stand to Sit Sit to Stand: 1: +2 Total assist Sit to Stand: Patient Percentage: 80% Stand to Sit: 1: +2 Total assist Stand to Sit: Patient Percentage: 80% Details for Transfer Assistance: Assist to rise, stabilize, control descent. VCs safety, technique, hand placement.  Ambulation/Gait Ambulation/Gait Assistance: 1: +2 Total assist Ambulation/Gait: Patient Percentage: 80% Ambulation Distance (Feet): 30 Feet (15' x 2) Ambulation/Gait Assistance Details: VCs safety, technique, posture, sequence. Assist to stabilize throughout ambulation. Fatigues easily.  Gait Pattern: Step-to pattern;Decreased stance time - left;Trunk flexed;Decreased weight shift to  left;Antalgic    Exercises     PT Diagnosis:    PT Problem List:   PT Treatment Interventions:     PT Goals Acute Rehab PT Goals Pt will go Supine/Side to Sit: with supervision PT Goal: Supine/Side to Sit - Progress: Progressing toward goal Pt will go Sit to Stand: with supervision PT Goal: Sit to Stand - Progress: Progressing toward goal Pt will go Stand to Sit: with supervision PT Goal: Stand to Sit - Progress: Progressing toward goal Pt will Ambulate: 51 - 150 feet;with supervision;with least restrictive assistive device PT Goal: Ambulate - Progress: Progressing toward goal  Visit Information  Last PT Received On: 12/09/11 Assistance Needed: +2    Subjective Data  Subjective: "I don't want to walk in the hall today" Patient Stated Goal: Home   Cognition  Overall Cognitive Status: Impaired Area of Impairment: Safety/judgement Arousal/Alertness: Awake/alert Orientation Level: Appears intact for tasks assessed Behavior During Session: [facility] for tasks performed Safety/Judgement: Decreased awareness of safety precautions;Decreased safety judgement for tasks assessed;Impulsive    Balance     End of Session PT - End of Session Equipment Utilized During Treatment: Left knee immobilizer Activity Tolerance: Patient limited by fatigue;Patient limited by pain Patient left: in chair;with call bell/phone within reach   GP     Rebeca Alert [facility] 12/09/2011, 2:50 PM [phone]

## 2011-12-10 ENCOUNTER — Inpatient Hospital Stay (HOSPITAL_COMMUNITY): Payer: Medicare Other

## 2011-12-10 DIAGNOSIS — E119 Type 2 diabetes mellitus without complications: Secondary | ICD-10-CM

## 2011-12-10 DIAGNOSIS — N183 Chronic kidney disease, stage 3 unspecified: Secondary | ICD-10-CM

## 2011-12-10 DIAGNOSIS — E871 Hypo-osmolality and hyponatremia: Secondary | ICD-10-CM

## 2011-12-10 DIAGNOSIS — R509 Fever, unspecified: Secondary | ICD-10-CM

## 2011-12-10 DIAGNOSIS — D62 Acute posthemorrhagic anemia: Secondary | ICD-10-CM

## 2011-12-10 DIAGNOSIS — I10 Essential (primary) hypertension: Secondary | ICD-10-CM

## 2011-12-10 DIAGNOSIS — N390 Urinary tract infection, site not specified: Secondary | ICD-10-CM

## 2011-12-10 DIAGNOSIS — G9340 Encephalopathy, unspecified: Secondary | ICD-10-CM

## 2011-12-10 LAB — CBC
Hemoglobin: 7.8 g/dL — ABNORMAL LOW (ref 13.0–17.0)
MCH: 32.2 pg (ref 26.0–34.0)
MCV: 94.2 fL (ref 78.0–100.0)
RBC: 2.42 MIL/uL — ABNORMAL LOW (ref 4.22–5.81)

## 2011-12-10 LAB — BASIC METABOLIC PANEL
CO2: 23 mEq/L (ref 19–32)
Calcium: 8.2 mg/dL — ABNORMAL LOW (ref 8.4–10.5)
Creatinine, Ser: 1.3 mg/dL (ref 0.50–1.35)
Glucose, Bld: 257 mg/dL — ABNORMAL HIGH (ref 70–99)

## 2011-12-10 LAB — GLUCOSE, CAPILLARY

## 2011-12-10 LAB — URINE CULTURE
Colony Count: NO GROWTH
Culture: NO GROWTH

## 2011-12-10 MED ORDER — POTASSIUM CHLORIDE CRYS ER 20 MEQ PO TBCR
40.0000 meq | EXTENDED_RELEASE_TABLET | Freq: Two times a day (BID) | ORAL | Status: AC
  Administered 2011-12-10 (×2): 40 meq via ORAL
  Filled 2011-12-10 (×2): qty 2

## 2011-12-10 MED ORDER — SODIUM CHLORIDE 0.9 % IV SOLN: INTRAVENOUS | Status: AC

## 2011-12-10 MED ORDER — DEXTROSE 5 % IV SOLN
1.0000 g | INTRAVENOUS | Status: DC
  Administered 2011-12-11: 1 g via INTRAVENOUS
  Filled 2011-12-10: qty 10

## 2011-12-10 MED ORDER — DEXTROSE 5 % IV SOLN
1.0000 g | INTRAVENOUS | Status: AC
  Administered 2011-12-10: 1 g via INTRAVENOUS
  Filled 2011-12-10 (×2): qty 10

## 2011-12-10 NOTE — Progress Notes (Signed)
Clinical Social Work Department CLINICAL SOCIAL WORK PLACEMENT NOTE 12/10/2011  Patient:  Peter Bowen, Peter Bowen  Account Number:  [id number] Admit date:  12/06/2011  Clinical Social Worker:  Leron Croak, CLINICAL SOCIAL WORKER  Date/time:  12/10/2011 01:42 PM  Clinical Social Work is seeking post-discharge placement for this patient at the following level of care:   SKILLED NURSING   (*CSW will update this form in Epic as items are completed)   12/10/2011  Patient/family provided with [facility] Department of Clinical Social Work's list of facilities offering this level of care within the geographic area requested by the patient (or if unable, by the patient's family).  12/10/2011  Patient/family informed of their freedom to choose among providers that offer the needed level of care, that participate in Medicare, Medicaid or managed care program needed by the patient, have an available bed and are willing to accept the patient.  12/10/2011  Patient/family informed of [facility]' ownership interest in [facility], as well as of the fact that they are under no obligation to receive care at this facility.  PASARR submitted to EDS on 12/10/2011 PASARR number received from EDS on 12/10/2011  FL2 transmitted to all facilities in geographic area requested by pt/family on  12/10/2011 FL2 transmitted to all facilities within larger geographic area on 12/10/2011  Patient informed that his/her managed care company has contracts with or will negotiate with  certain facilities, including the following:     Patient/family informed of bed offers received:   Patient chooses bed at  Physician recommends and patient chooses bed at    Patient to be transferred to  on   Patient to be transferred to facility by   The following physician request were entered in Epic:   Additional Comments:  Leron Croak, Leeroy Bock [employer] Weekend Coverage [phone]

## 2011-12-10 NOTE — Progress Notes (Signed)
Physical Therapy Treatment Patient Details Name: Peter Bowen. MRN: [id number] DOB: [birth year] Today's Date: 12/10/2011 Time: 7829-5621 PT Time Calculation (min): 15 min  PT Assessment / Plan / Recommendation Comments on Treatment Session  Pt refused to walk today, "It's Sunday, I need a day of rest" and "I was walking around down at x-ray this morning." Encouraged pt to ambulate later today. Pt did agree to ther ex.  Noted pt lacks full extension of L knee, pt states this is pre-existing. Muscle guarding noted with Active assisted knee flexion. Per RN pt has refused pain meds.     Follow Up Recommendations  Post acute inpatient     Does the patient have the potential to tolerate intense rehabilitation  No, Recommend SNF  Barriers to Discharge        Equipment Recommendations  Rolling walker with 5" wheels;3 in 1 bedside comode    Recommendations for Other Services    Frequency 7X/week   Plan Discharge plan remains appropriate;Frequency remains appropriate    Precautions / Restrictions Precautions Precautions: Knee;Fall Knee Immobilizer - Left: Discontinue once straight leg raise with < 10 degree lag Restrictions Weight Bearing Restrictions: No LLE Weight Bearing: Weight bearing as tolerated   Pertinent Vitals/Pain **pt refused ice after PT, refusing pain meds per RN*    Mobility  Bed Mobility Bed Mobility: Not assessed Transfers Transfers: Not assessed Details for Transfer Assistance: pt refused Ambulation/Gait Ambulation/Gait Assistance: Not tested (comment) (pt refused)    Exercises Total Joint Exercises Ankle Circles/Pumps: AROM;Both;15 reps;Supine Quad Sets: AROM;Both;10 reps;Supine Short Arc Quad: AAROM;Left;10 reps;Supine Heel Slides: AAROM;Left;10 reps;Supine Hip ABduction/ADduction: AAROM;Left;10 reps;Supine Straight Leg Raises: AAROM;Left;10 reps;Supine   PT Diagnosis:    PT Problem List:   PT Treatment Interventions:     PT Goals Acute Rehab  PT Goals Pt will Perform Home Exercise Program: with supervision, verbal cues required/provided PT Goal: Perform Home Exercise Program - Progress: Progressing toward goal  Visit Information  Last PT Received On: 12/10/11 Assistance Needed: +1    Subjective Data  Subjective: "Today is Sunday, I need a day of rest!" (when asked to walk) Patient Stated Goal: home   Cognition  Overall Cognitive Status: Impaired Area of Impairment: Safety/judgement Arousal/Alertness: Awake/alert Orientation Level: Appears intact for tasks assessed Behavior During Session: [facility] for tasks performed Safety/Judgement: Decreased awareness of safety precautions;Decreased safety judgement for tasks assessed;Impulsive    Balance     End of Session PT - End of Session Activity Tolerance: Patient tolerated treatment well Patient left: in chair;with call bell/phone within reach   GP     Tamala Ser 12/10/2011, 12:42 PM [phone]

## 2011-12-10 NOTE — Progress Notes (Signed)
   Subjective: 4 Days Post-Op Procedure(s) (LRB): TOTAL KNEE ARTHROPLASTY (Left)   Patient reports pain as mild. He states that his medication makes him not feel well. No events throughout the night.  Objective:   VITALS:   Filed Vitals:   12/10/11 0800  BP: 136/67  Pulse: 97  Temp: 101.2 F (38.4 C)   Resp: 16    Neurovascular intact Dorsiflexion/Plantar flexion intact Incision: dressing C/D/I No cellulitis present Compartment soft  LABS  Basename 12/09/11 0553 12/08/11 0340  HGB 8.0* 8.8*  HCT 23.5* 26.0*  WBC 12.6* 11.3*  PLT 143* 150     Basename 12/09/11 0953 12/08/11 0340  NA 131* 130*  K 3.3* 3.8  BUN 18 12  CREATININE 1.42* 1.27  GLUCOSE 218* 224*     Assessment/Plan: 4 Days Post-Op Procedure(s) (LRB): TOTAL KNEE ARTHROPLASTY (Left) Appreciate medicine seeing patient Has continues fever and evaluation source is ongoing with medicine ordering a CXR Wait results of both CXR and cultures from [facility] Up with therapy Discharge home with home health eventually when ready   Anastasio Auerbach. Sydney Hasten   PAC  12/10/2011, 9:50 AM

## 2011-12-10 NOTE — Consult Note (Signed)
[facility] CONSULT FOLLOW-UP NOTE  Peter Bowen. ZOX:[id number] DOB: 1940-04-15 DOA: 12/06/2011 PCP: No primary provider on file. Requesting physician: Ranee Gosselin, MD Attending service: Orthopedics Reason for consultation: fever  Impression/Recommendations: 1. Acute encephalopathy--resolved, presumed secondary to narcotics, possible UTI. 2. Fever--negative CXR, no hypoxia or tachypnea. Differential includes UTI, atelectasis. Obtain blood cultures for repeat fever.  3. Possible UTI--empiric Rocephin, follow-up culture 4. Hyponatremia--mild, doubt contributed to confusion. Likely related to mild dehydration. IVF, check BMP in AM. 5. Hypokalemia--replete. 6. ABLA--post operative. Appears to be stabilizing. Follow CBC.  7. DM--fair control. Continue Amaryl, Tradjenta, SSI; hold metformin while inpatient, resume on discharge. 8. HTN--well controlled. Continue Norvasc, Lotensin, Toprol XL. 9. CKD stage III--stable.  Will follow with you.  Code Status: full code Family Communication: none present Disposition Plan: per orthopedics  Brendia Sacks, MD  [facility] Team 6 Pager [phone]. If 8PM-8AM, please contact night-coverage at [email or web address], password [facility] 12/10/2011, 11:02 AM  LOS: 4 days   Brief narrative: 71 year old man s/p total knee arthroplasty 10/16, noted to be confused 10/19 and hospitalist consultation requested for confusion. Noted to be febrile and urinalysis suggested UTI. Pain medication was suspected to be contributing to confusion.  Antibiotics:  Ceftriaxone 10/19 >>  HPI/Subjective: Feels ok. Left leg pain noted.  Objective: Filed Vitals:   12/09/11 1600 12/09/11 2130 12/10/11 0621 12/10/11 0800  BP:  130/69 136/67   Pulse:  99 97   Temp:  100.2 F (37.9 C) 101.2 F (38.4 C)   TempSrc:  Oral Oral   Resp: 16 18 16 16   Height:      Weight:      SpO2: 94% 95% 95% 96%    Intake/Output Summary (Last 24 hours) at 12/10/11  1102 Last data filed at 12/10/11 0945  Gross per 24 hour  Intake   2390 ml  Output   1800 ml  Net    590 ml    Exam:  General:  Appears calm and comfortable Cardiovascular: RRR, no m/r/g. 2+ LLE edema. Respiratory: CTA bilaterally, no w/r/r. Normal respiratory effort. Psychiatric: grossly normal mood and affect, speech fluent and appropriate  Data Reviewed: Basic Metabolic Panel:  Lab 12/10/11 8295 12/09/11 0953 12/08/11 0340 12/07/11 0345  NA 133* 131* 130* 131*  K 3.3* 3.3* 3.8 4.1  CL 97 95* 97 98  CO2 23 24 24 23   GLUCOSE 257* 218* 224* 229*  BUN 18 18 12 14   CREATININE 1.30 1.42* 1.27 1.09  CALCIUM 8.2* 8.6 8.5 8.5  MG -- -- -- --  PHOS -- -- -- --   CBC:  Lab 12/10/11 0926 12/09/11 0553 12/08/11 0340 12/07/11 0345  WBC 12.1* 12.6* 11.3* 6.8  NEUTROABS -- -- -- --  HGB 7.8* 8.0* 8.8* 9.8*  HCT 22.8* 23.5* 26.0* 28.4*  MCV 94.2 94.8 95.2 95.3  PLT 201 143* 150 179   CBG:  Lab 12/10/11 0754 12/09/11 2204 12/09/11 1713 12/09/11 1154 12/09/11 0703  GLUCAP 254* 222* 172* 210* 204*    Recent Results (from the past 240 hour(s))  SURGICAL PCR SCREEN     Status: Normal   Collection Time   12/01/11  3:10 PM      Component Value Range Status Comment   MRSA, PCR NEGATIVE  NEGATIVE Final    Staphylococcus aureus NEGATIVE  NEGATIVE Final      Studies: Dg Chest 2 View  12/10/2011  *RADIOLOGY REPORT*  Clinical Data: Fever, confusion  CHEST - 2 VIEW  Comparison: 12/01/2011  Findings:  Cardiomediastinal silhouette is stable.  No acute infiltrate or pleural effusion.  No pulmonary edema.  Again noted mild degenerative changes thoracic spine.  IMPRESSION: No active disease.  No significant change.   Original Report Authenticated By: Natasha Mead, M.D.    Scheduled Meds:   . amLODipine  10 mg Oral Daily   And  . benazepril  20 mg Oral Daily  . cefTRIAXone (ROCEPHIN)  IV  1 g Intravenous Once  . ferrous sulfate  325 mg Oral TID PC  . glimepiride  4 mg Oral BID AC  .  insulin aspart  0-15 Units Subcutaneous TID WC  . linagliptin  5 mg Oral Daily  . metFORMIN  1,000 mg Oral BID WC  . metoprolol succinate  50 mg Oral Daily  . rivaroxaban  10 mg Oral Q breakfast   Continuous Infusions:   . sodium chloride 75 mL/hr at 12/10/11 0247  . DISCONTD: lactated ringers 20 mL/hr at 12/08/11 1500     Active Problems:  Primary osteoarthritis of left knee  Acute blood loss anemia  Acute encephalopathy  Urinary tract infection  Hyponatremia  Fever  DM type 2 (diabetes mellitus, type 2)  HTN (hypertension)  CKD (chronic kidney disease), stage III    Time Spent: 25 minutes  Brendia Sacks, MD  [facility] Team 6 Pager [phone]. If 8PM-8AM, please contact night-coverage at [email or web address], password [facility] 12/10/2011, 11:02 AM  LOS: 4 days

## 2011-12-10 NOTE — Progress Notes (Signed)
Clinical Social Work Department BRIEF PSYCHOSOCIAL ASSESSMENT 12/10/2011  Patient:  Peter Bowen, Peter Bowen     Account Number:  [id number]     Admit date:  12/06/2011  Clinical Social Worker:  Leron Croak, CLINICAL SOCIAL WORKER  Date/Time:  12/10/2011 01:28 PM  Referred by:  Physician  Date Referred:  12/08/2011 Referred for  SNF Placement   Other Referral:   Interview type:  Patient Other interview type:    PSYCHOSOCIAL DATA Living Status:  FAMILY Admitted from facility:   Level of care:   Primary support name:  Delilah Shan Primary support relationship to patient:  SPOUSE Degree of support available:   Pt has good support    CURRENT CONCERNS Current Concerns  Post-Acute Placement   Other Concerns:    SOCIAL WORK ASSESSMENT / PLAN CSW met with Pt for d/c planning. Pt was not wanting to talk much about skilled nursing, however did give permission for CSW to send off information to [facility] for rehab placement.   Assessment/plan status:  Information/Referral to [facility] Other assessment/ plan:   Information/referral to community resources:   CSW provided Pt with listing of SN facilities in the area.    PATIENT'S/FAMILY'S RESPONSE TO PLAN OF CARE: Pt was appreciative for assistance.     Leron Croak, LCSWA [employer] [phone]

## 2011-12-10 NOTE — Progress Notes (Signed)
PT Cancellation Note  Patient Details Name: Peter Bowen. MRN: [id number] DOB: 1940/08/22   Cancelled Treatment:    Reason Eval/Treat Not Completed: Other (comment) (pt refused)"I'm tired."   [name] 12/10/2011, 2:01 PM [phone]

## 2011-12-11 DIAGNOSIS — Z09 Encounter for follow-up examination after completed treatment for conditions other than malignant neoplasm: Secondary | ICD-10-CM

## 2011-12-11 LAB — BASIC METABOLIC PANEL
BUN: 15 mg/dL (ref 6–23)
CO2: 22 mEq/L (ref 19–32)
Chloride: 97 mEq/L (ref 96–112)
GFR calc Af Amer: 67 mL/min — ABNORMAL LOW (ref >90)
Glucose, Bld: 293 mg/dL — ABNORMAL HIGH (ref 70–99)
Potassium: 3.7 mEq/L (ref 3.5–5.1)

## 2011-12-11 LAB — CBC
HCT: 21.5 % — ABNORMAL LOW (ref 39.0–52.0)
Hemoglobin: 7.4 g/dL — ABNORMAL LOW (ref 13.0–17.0)
MCH: 32.5 pg (ref 26.0–34.0)
MCHC: 34.4 g/dL (ref 30.0–36.0)

## 2011-12-11 LAB — PREPARE RBC (CROSSMATCH)

## 2011-12-11 LAB — GLUCOSE, CAPILLARY: Glucose-Capillary: 260 mg/dL — ABNORMAL HIGH (ref 70–99)

## 2011-12-11 MED ORDER — INSULIN ASPART PROT & ASPART (70-30 MIX) 100 UNIT/ML ~~LOC~~ SUSP
10.0000 [IU] | Freq: Two times a day (BID) | SUBCUTANEOUS | Status: DC
  Administered 2011-12-11 – 2011-12-12 (×2): 10 [IU] via SUBCUTANEOUS
  Filled 2011-12-11: qty 3

## 2011-12-11 NOTE — Progress Notes (Signed)
   Subjective: 5 Days Post-Op Procedure(s) (LRB): TOTAL KNEE ARTHROPLASTY (Left) Patient reports pain as moderate.   Patient seen in rounds without Dr. Darrelyn Hillock Patient is well, and has had no acute complaints or problems. He is frustrated with the physical therapy he has hd. He reports that they are not coming to work with him enough. He denies chest pain and shortness of breath. His wife decided over the weekend that she was not going to be able to take care of him after arrangements were made for him to go home Saturday.  Plan is to go Skilled nursing facility after hospital stay.  Objective: Vital signs in last 24 hours: Temp:  [98.5 F (36.9 C)-100.9 F (38.3 C)] 98.5 F (36.9 C) (10/21 0538) Pulse Rate:  [96-99] 96  (10/21 0538) Resp:  [16-20] 18  (10/21 0538) BP: (125-154)/(67-76) 154/75 mmHg (10/21 0538) SpO2:  [91 %-98 %] 94 % (10/21 0538)  Intake/Output from previous day:  Intake/Output Summary (Last 24 hours) at 12/11/11 1217 Last data filed at 12/11/11 0800  Gross per 24 hour  Intake 1932.5 ml  Output   2125 ml  Net -192.5 ml    Intake/Output this shift: Total I/O In: 240 [P.O.:240] Out: -   Labs:  Basename 12/11/11 0354 12/10/11 0926 12/09/11 0553  HGB 7.4* 7.8* 8.0*    Basename 12/11/11 0354 12/10/11 0926  WBC 10.5 12.1*  RBC 2.28* 2.42*  HCT 21.5* 22.8*  PLT 221 201    Basename 12/11/11 0354 12/10/11 0926  NA 130* 133*  K 3.7 3.3*  CL 97 97  CO2 22 23  BUN 15 18  CREATININE 1.24 1.30  GLUCOSE 293* 257*  CALCIUM 8.2* 8.2*    EXAM General - Patient is Alert and Oriented Extremity - Neurologically intact Neurovascular intact Dorsiflexion/Plantar flexion intact No cellulitis present Dressing/Incision - clean, dry, no drainage Motor Function - intact, moving foot and toes well on exam.   Past Medical History  Diagnosis Date  . Diabetes mellitus without complication   . Neuromuscular disorder     parathesias bilateral feet  .  Hypertension   . Edema of both legs   . Hyperlipidemia   . Rhinitis, allergic   . Peripheral vascular disease     lower edema atherosclerosis  . Arthritis   . Chronic kidney disease     renal insufficiency per PCP note 11/17/11  . Cough     Assessment/Plan: 5 Days Post-Op Procedure(s) (LRB): TOTAL KNEE ARTHROPLASTY (Left) Active Problems:  Primary osteoarthritis of left knee  Acute blood loss anemia  Acute encephalopathy  Urinary tract infection  Hyponatremia  Fever  DM type 2 (diabetes mellitus, type 2)  HTN (hypertension)  CKD (chronic kidney disease), stage III  Estimated Body mass index is 31.00 kg/(m^2) as calculated from the following:   Height as of this encounter: 6\' 3" (1.905 m).   Weight as of this encounter: 248 lb(112.492 kg).  Advance diet Up with therapy Plan for discharge tomorrow Discharge to SNF tomorrow  DVT Prophylaxis - Xarelto Weight-Bearing as tolerated to left leg  His Hgb has continued to go down. He is 7.4 today. We are going to transfuse 2 units today and get him ready to go to SNF tomorrow. His wife is visiting some facilities today.   Arsenia Goracke LAUREN 12/11/2011, 12:17 PM

## 2011-12-11 NOTE — Progress Notes (Addendum)
Physical Therapy Treatment Patient Details Name: Peter Bowen. MRN: [id number] DOB: May 18, 1940 Today's Date: 12/11/2011 Time: 9528-4132 PT Time Calculation (min): 26 min  PT Assessment / Plan / Recommendation Comments on Treatment Session  Pt performed exercises and ambulated in hallway.  Pt asymptomatic to low hgb (per RN) and pt wished to ambulate (denied dizziness with mobility).  Plan is for pt to receive PRBCs and then d/c to SNF.    Follow Up Recommendations  Post acute inpatient     Does the patient have the potential to tolerate intense rehabilitation  No, Recommend SNF  Barriers to Discharge        Equipment Recommendations  Rolling walker with 5" wheels;3 in 1 bedside comode    Recommendations for Other Services    Frequency     Plan Discharge plan remains appropriate;Frequency remains appropriate    Precautions / Restrictions Precautions Precautions: Knee;Fall Required Braces or Orthoses: Knee Immobilizer - Left Knee Immobilizer - Left: Discontinue once straight leg raise with < 10 degree lag Restrictions LLE Weight Bearing: Weight bearing as tolerated   Pertinent Vitals/Pain 5/10 L knee, RN notified of pt request for tylenol.    Mobility  Bed Mobility Bed Mobility: Not assessed Transfers Transfers: Sit to Stand;Stand to Sit Sit to Stand: 4: Min assist;With upper extremity assist;From chair/3-in-1 Stand to Sit: 4: Min assist;With upper extremity assist;To chair/3-in-1 Details for Transfer Assistance: verbal cues for safe technique, assist to steady with rise and control descent Ambulation/Gait Ambulation/Gait Assistance: 4: Min guard Ambulation Distance (Feet): 40 Feet Assistive device: Rolling walker Ambulation/Gait Assistance Details: verbal cues for RW placement and posture Gait Pattern: Step-to pattern;Decreased stance time - left;Trunk flexed;Decreased weight shift to left;Antalgic Gait velocity: decreased General Gait Details: pt denied  dizziness with standing and during ambulation    Exercises Total Joint Exercises Ankle Circles/Pumps: AROM;Both;15 reps Quad Sets: PROM;Left Quad Sets Limitations: unable to perform quad sets with prompting so 3 min knee extension with towel under heel Short Arc Quad: AAROM;Left;15 reps Heel Slides: AAROM;Left;10 reps Hip ABduction/ADduction: AAROM;Left;20 reps   PT Diagnosis:    PT Problem List:   PT Treatment Interventions:     PT Goals Acute Rehab PT Goals PT Goal: Sit to Stand - Progress: Progressing toward goal PT Goal: Stand to Sit - Progress: Progressing toward goal PT Goal: Ambulate - Progress: Progressing toward goal PT Goal: Perform Home Exercise Program - Progress: Progressing toward goal  Visit Information  Last PT Received On: 12/11/11 Assistance Needed: +1    Subjective Data  Subjective: Why can't I put it in a whirlpool?  (discussed high risk of infection if LE submerged in water with nonhealed incision)   Cognition  Overall Cognitive Status: Impaired Area of Impairment: Safety/judgement Arousal/Alertness: Awake/alert Orientation Level: Appears intact for tasks assessed Behavior During Session: [facility] for tasks performed Safety/Judgement: Decreased awareness of safety precautions;Decreased safety judgement for tasks assessed;Impulsive Cognition - Other Comments: decreased insight into care for knee (believes treatments used from football days still apply to this situation)    Balance     End of Session PT - End of Session Equipment Utilized During Treatment: Left knee immobilizer Activity Tolerance: Patient tolerated treatment well Patient left: in chair;with call bell/phone within reach Nurse Communication: Patient requests pain meds   GP     Taren Dymek,KATHrine E 12/11/2011, 3:30 PM Pager: [phone]

## 2011-12-11 NOTE — Progress Notes (Signed)
Inpatient Diabetes Program Recommendations  AACE/ADA: New Consensus Statement on Inpatient Glycemic Control (2013)  Target Ranges:  Prepandial:   less than 140 mg/dL      Peak postprandial:   less than 180 mg/dL (1-2 hours)      Critically ill patients:  140 - 180 mg/dL   Reason for Visit: Sub-optimal glycemic control  Inpatient Diabetes Program Recommendations Insulin - Basal: On no basal insulin prior to admission or currently. Correction (SSI): Receiving moderate correction scale Insulin - Meal Coverage: No meal coverage currently Oral Agents: Receiving Amaryl 4 mg bid HgbA1C: No value available. Diet: Carb Modified Medium  Note:  Results for TALON, DISHNER (MRN [id number]) as of 12/11/2011 13:48  Ref. Range 12/10/2011 07:54 12/10/2011 12:42 12/10/2011 17:26 12/10/2011 21:54 12/11/2011 07:14 12/11/2011 12:32  Glucose-Capillary Latest Range: 70-99 mg/dL 846 (H) 962 (H) 952 (H) 252 (H) 329 (H) 260 (H)   Patient receiving blood transfusion today.  Per notes, will likely be ready for discharge to SNF tomorrow.    CBG before breakfast was 254 yesterday and today was 329.  Request MD consider adding Lantus 22 units daily.  Dosage can be titrated up or down while at SNF as metformin is restarted and hopefully patient's condition improves.   Thank you.  Demontay Grantham S. Elsie Lincoln, RN, CNS, CDE [phone]

## 2011-12-11 NOTE — Progress Notes (Signed)
VASCULAR LAB PRELIMINARY  PRELIMINARY  PRELIMINARY  PRELIMINARY  Bilateral lower extremity venous duplex  completed.    Preliminary report:  Bilateral:  No evidence of DVT, superficial thrombosis, or Baker's Cyst.    Aziel Morgan, RVT 12/11/2011, 4:40 PM

## 2011-12-11 NOTE — Consult Note (Signed)
[facility] CONSULT FOLLOW-UP NOTE  Peter Bowen. ZOX:[id number] DOB: 1940-08-29 DOA: 12/06/2011 PCP: No primary provider on file. Requesting physician: Ranee Gosselin, MD Attending service: Orthopedics Reason for consultation: fever  Impression/Recommendations: 1. Acute encephalopathy--resolved, presumed secondary to narcotics. 2. Fever--recurrent today, etiology unclear. Negative CXR 10/20, no hypoxia or tachypnea. Differential includes atelectasis. IS hourly while awake.Obtain blood cultures for repeat fever if occurs again (I was not notified of fever earlier today). Check BLE venous dopplers to exclude DVT. 3. Possible UTI--urine culture no growth, collected prior to start of Rocephin. Stop Rocephin 4. Hyponatremia--post-operative phenomenon. Stable. Follow. Etiology unclear.  5. Hypokalemia--repleted. 6. ABLA--deferred to orthopedics (actively managing). 7. DM--worse control last 24 hous. Continue Amaryl, Tradjenta, SSI; hold metformin while inpatient, resume on discharge. Resume 70/30 insulin. 8. HTN--well controlled. Continue Norvasc, Lotensin, Toprol XL. 9. CKD stage III--stable.  Will follow with you.  Code Status: full code Family Communication: discussed with wife at bedside Disposition Plan: per orthopedics  Brendia Sacks, MD  [facility] Team 6 Pager [phone]. 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 Noted to be febrile and urinalysis suggested UTI. Pain medication was suspected to be contributing to confusion.  Antibiotics:  Ceftriaxone 10/19 >>  HPI/Subjective: Complains of left leg pain.  Objective: Filed Vitals:   12/10/11 2132 12/10/11 2135 12/11/11 0538 12/11/11 1400  BP: 125/67  154/75 138/68  Pulse: 99  96 96    Temp: 100.9 F (38.3 C) 100.1 F (37.8 C) 98.5 F (36.9 C) 102.9 F (39.4 C)  TempSrc: Oral Oral Oral Oral  Resp: 20  18 18   Height:      Weight:      SpO2: 91%  94% 96%    Intake/Output Summary (Last 24 hours) at 12/11/11 1548 Last data filed at 12/11/11 0800  Gross per 24 hour  Intake 1452.5 ml  Output   1425 ml  Net   27.5 ml    Exam:  General:  Appears calm and comfortable Cardiovascular: RRR, no m/r/g.  Respiratory: CTA bilaterally, no w/r/r. Normal respiratory effort. Psychiatric: grossly normal mood and affect, speech fluent and appropriate  Data Reviewed: Basic Metabolic Panel:  Lab 12/11/11 8295 12/10/11 0926 12/09/11 0953 12/08/11 0340 12/07/11 0345  NA 130* 133* 131* 130* 131*  K 3.7 3.3* 3.3* 3.8 4.1  CL 97 97 95* 97 98  CO2 22 23 24 24 23   GLUCOSE 293* 257* 218* 224* 229*  BUN 15 18 18 12 14   CREATININE 1.24 1.30 1.42* 1.27 1.09  CALCIUM 8.2* 8.2* 8.6 8.5 8.5  MG -- -- -- -- --  PHOS -- -- -- -- --   CBC:  Lab 12/11/11 0354 12/10/11 0926 12/09/11 0553 12/08/11 0340 12/07/11 0345  WBC 10.5 12.1* 12.6* 11.3* 6.8  NEUTROABS -- -- -- -- --  HGB 7.4* 7.8* 8.0* 8.8* 9.8*  HCT 21.5* 22.8* 23.5* 26.0* 28.4*  MCV 94.3 94.2 94.8 95.2 95.3  PLT 221 201 143* 150 179   CBG:  Lab 12/11/11 1232 12/11/11 0714 12/10/11 2154 12/10/11 1726 12/10/11 1242  GLUCAP 260* 329* 252* 295* 258*    Recent Results (from the past 240 hour(s))  URINE CULTURE     Status: Normal   Collection Time   12/09/11  9:36 AM      Component Value Range Status Comment  Specimen Description URINE, CLEAN CATCH   Final    Special Requests NONE   Final    Culture  Setup Time 12/09/2011 19:09   Final    Colony Count NO GROWTH   Final    Culture NO GROWTH   Final    Report Status 12/10/2011 FINAL   Final      Studies: Dg Chest 2 View  12/10/2011  *RADIOLOGY REPORT*  Clinical Data: Fever, confusion  CHEST - 2 VIEW  Comparison: 12/01/2011  Findings: Cardiomediastinal silhouette  is stable.  No acute infiltrate or pleural effusion.  No pulmonary edema.  Again noted mild degenerative changes thoracic spine.  IMPRESSION: No active disease.  No significant change.   Original Report Authenticated By: Natasha Mead, M.D.      Scheduled Meds:    . amLODipine  10 mg Oral Daily   And  . benazepril  20 mg Oral Daily  . cefTRIAXone (ROCEPHIN)  IV  1 g Intravenous Q24H  . ferrous sulfate  325 mg Oral TID PC  . glimepiride  4 mg Oral BID AC  . insulin aspart  0-15 Units Subcutaneous TID WC  . linagliptin  5 mg Oral Daily  . metoprolol succinate  50 mg Oral Daily  . potassium chloride  40 mEq Oral BID  . rivaroxaban  10 mg Oral Q breakfast   Continuous Infusions:    . sodium chloride 75 mL/hr at 12/10/11 1126     Active Problems:  Primary osteoarthritis of left knee  Acute blood loss anemia  Acute encephalopathy  Urinary tract infection  Hyponatremia  Fever  DM type 2 (diabetes mellitus, type 2)  HTN (hypertension)  CKD (chronic kidney disease), stage III    Time Spent: 25 minutes  Brendia Sacks, MD  [facility] Team 6 Pager [phone]. If 8PM-8AM, please contact night-coverage at [email or web address], password [facility] 12/11/2011, 3:48 PM  LOS: 5 days

## 2011-12-11 NOTE — Discharge Summary (Addendum)
Physician Discharge Summary   Patient ID: Peter Bowen. MRN: [id number] DOB/AGE: 1940/05/17 71 y.o.  Admit date: 12/06/2011 Discharge date: 12/12/2011  Primary Diagnosis:  Osteoarthritis, left knee  Admission Diagnoses:  Past Medical History  Diagnosis Date  . Diabetes mellitus without complication   . Neuromuscular disorder     parathesias bilateral feet  . Hypertension   . Edema of both legs   . Hyperlipidemia   . Rhinitis, allergic   . Peripheral vascular disease     lower edema atherosclerosis  . Arthritis   . Chronic kidney disease     renal insufficiency per PCP note 11/17/11  . Cough    Discharge Diagnoses:   Active Problems:  Primary osteoarthritis of left knee  Acute blood loss anemia  Acute encephalopathy  Urinary tract infection  Hyponatremia  Fever  DM type 2 (diabetes mellitus, type 2)  HTN (hypertension)  CKD (chronic kidney disease), stage III S/P left total knee arthroplasty  Estimated Body mass index is 31.00 kg/(m^2) as calculated from the following:   Height as of this encounter: 6\' 3" (1.905 m).   Weight as of this encounter: 248 lb(112.492 kg).  Classification of overweight in adults according to BMI (WHO, 1998)   Procedure:  Procedure(s) (LRB): TOTAL KNEE ARTHROPLASTY (Left)   Consults: Internal medicine  HPI: Peter Bowen., 71 y.o. male, has a history of pain and functional disability in the left knee due to arthritis and has failed non-surgical conservative treatments for greater than 12 weeks to includeNSAID's and/or analgesics, corticosteriod injections, use of assistive devices and activity modification. Onset of symptoms was gradual, starting >10 years ago with gradually worsening course since that time. The patient noted no past surgery on the left knee(s). Patient currently rates pain in the left knee(s) at 7 out of 10 with activity. Patient has night pain, worsening of pain with activity and weight bearing, pain that  interferes with activities of daily living and crepitus. Patient has evidence of periarticular osteophytes and joint space narrowing by imaging studies. There is no active infection.   Laboratory Data: Admission on 12/06/2011  Component Date Value Range Status  . ABO/RH(D) 12/06/2011 B POS   Final  . Antibody Screen 12/06/2011 NEG   Final  . Sample Expiration 12/06/2011 12/09/2011   Final  . ABO/RH(D) 12/06/2011 B POS   Final  . Glucose-Capillary 12/06/2011 118* 70 - 99 mg/dL Final  . Glucose-Capillary 12/06/2011 136* 70 - 99 mg/dL Final  . Comment 1 78/29/5621 Documented in Chart   Final  . Comment 2 12/06/2011 Notify RN   Final  . WBC 12/07/2011 6.8  4.0 - 10.5 K/uL Final  . RBC 12/07/2011 2.98* 4.22 - 5.81 MIL/uL Final  . Hemoglobin 12/07/2011 9.8* 13.0 - 17.0 g/dL Final  . HCT 30/86/5784 28.4* 39.0 - 52.0 % Final  . MCV 12/07/2011 95.3  78.0 - 100.0 fL Final  . MCH 12/07/2011 32.9  26.0 - 34.0 pg Final  . MCHC 12/07/2011 34.5  30.0 - 36.0 g/dL Final  . RDW 69/62/9528 12.4  11.5 - 15.5 % Final  . Platelets 12/07/2011 179  150 - 400 K/uL Final  . Sodium 12/07/2011 131* 135 - 145 mEq/L Final  . Potassium 12/07/2011 4.1  3.5 - 5.1 mEq/L Final  . Chloride 12/07/2011 98  96 - 112 mEq/L Final  . CO2 12/07/2011 23  19 - 32 mEq/L Final  . Glucose, Bld 12/07/2011 229* 70 - 99 mg/dL Final  . BUN 41/32/4401  14  6 - 23 mg/dL Final  . Creatinine, Ser 12/07/2011 1.09  0.50 - 1.35 mg/dL Final  . Calcium 19/14/7829 8.5  8.4 - 10.5 mg/dL Final  . GFR calc non Af Amer 12/07/2011 67* >90 mL/min Final  . GFR calc Af Amer 12/07/2011 78* >90 mL/min Final   Comment:                                 The eGFR has been calculated                          using the CKD EPI equation.                          This calculation has not been                          validated in all clinical                          situations.                          eGFR's persistently                          <90 mL/min  signify                          possible Chronic Kidney Disease.  . Glucose-Capillary 12/06/2011 200* 70 - 99 mg/dL Final  . Glucose-Capillary 12/07/2011 224* 70 - 99 mg/dL Final  . Glucose-Capillary 12/07/2011 177* 70 - 99 mg/dL Final  . WBC 56/21/3086 11.3* 4.0 - 10.5 K/uL Final  . RBC 12/08/2011 2.73* 4.22 - 5.81 MIL/uL Final  . Hemoglobin 12/08/2011 8.8* 13.0 - 17.0 g/dL Final  . HCT 57/84/6962 26.0* 39.0 - 52.0 % Final  . MCV 12/08/2011 95.2  78.0 - 100.0 fL Final  . MCH 12/08/2011 32.2  26.0 - 34.0 pg Final  . MCHC 12/08/2011 33.8  30.0 - 36.0 g/dL Final  . RDW 95/28/4132 12.5  11.5 - 15.5 % Final  . Platelets 12/08/2011 150  150 - 400 K/uL Final  . Sodium 12/08/2011 130* 135 - 145 mEq/L Final  . Potassium 12/08/2011 3.8  3.5 - 5.1 mEq/L Final  . Chloride 12/08/2011 97  96 - 112 mEq/L Final  . CO2 12/08/2011 24  19 - 32 mEq/L Final  . Glucose, Bld 12/08/2011 224* 70 - 99 mg/dL Final  . BUN 44/02/270 12  6 - 23 mg/dL Final  . Creatinine, Ser 12/08/2011 1.27  0.50 - 1.35 mg/dL Final  . Calcium 53/66/4403 8.5  8.4 - 10.5 mg/dL Final  . GFR calc non Af Amer 12/08/2011 56* >90 mL/min Final  . GFR calc Af Amer 12/08/2011 65* >90 mL/min Final   Comment:                                 The eGFR has been calculated                          using the CKD EPI equation.  This calculation has not been                          validated in all clinical                          situations.                          eGFR's persistently                          <90 mL/min signify                          possible Chronic Kidney Disease.  . Glucose-Capillary 12/07/2011 222* 70 - 99 mg/dL Final  . Glucose-Capillary 12/07/2011 203* 70 - 99 mg/dL Final  . Comment 1 16/11/9602 Notify RN   Final  . Comment 2 12/07/2011 Documented in Chart   Final  . Glucose-Capillary 12/08/2011 232* 70 - 99 mg/dL Final  . Comment 1 54/10/8117 Notify RN   Final  . Comment 2 12/08/2011  Documented in Chart   Final  . Glucose-Capillary 12/08/2011 174* 70 - 99 mg/dL Final  . Glucose-Capillary 12/08/2011 173* 70 - 99 mg/dL Final  . Comment 1 14/78/2956 Notify RN   Final  . Comment 2 12/08/2011 Documented in Chart   Final  . Glucose-Capillary 12/08/2011 200* 70 - 99 mg/dL Final  . Color, Urine 21/30/8657 Peter Bowen* YELLOW Final   BIOCHEMICALS MAY BE AFFECTED BY COLOR  . APPearance 12/08/2011 CLOUDY* CLEAR Final  . Specific Gravity, Urine 12/08/2011 1.022  1.005 - 1.030 Final  . pH 12/08/2011 5.5  5.0 - 8.0 Final  . Glucose, UA 12/08/2011 NEGATIVE  NEGATIVE mg/dL Final  . Hgb urine dipstick 12/08/2011 SMALL* NEGATIVE Final  . Bilirubin Urine 12/08/2011 NEGATIVE  NEGATIVE Final  . Ketones, ur 12/08/2011 TRACE* NEGATIVE mg/dL Final  . Protein, ur 84/69/6295 100* NEGATIVE mg/dL Final  . Urobilinogen, UA 12/08/2011 1.0  0.0 - 1.0 mg/dL Final  . Nitrite 28/41/3244 POSITIVE* NEGATIVE Final  . Leukocytes, UA 12/08/2011 MODERATE* NEGATIVE Final  . Squamous Epithelial / LPF 12/08/2011 FEW* RARE Final  . WBC, UA 12/08/2011 7-10  <3 WBC/hpf Final  . RBC / HPF 12/08/2011 0-2  <3 RBC/hpf Final  . Bacteria, UA 12/08/2011 FEW* RARE Final  . WBC 12/09/2011 12.6* 4.0 - 10.5 K/uL Final  . RBC 12/09/2011 2.48* 4.22 - 5.81 MIL/uL Final  . Hemoglobin 12/09/2011 8.0* 13.0 - 17.0 g/dL Final  . HCT 02/22/7251 23.5* 39.0 - 52.0 % Final  . MCV 12/09/2011 94.8  78.0 - 100.0 fL Final  . MCH 12/09/2011 32.3  26.0 - 34.0 pg Final  . MCHC 12/09/2011 34.0  30.0 - 36.0 g/dL Final  . RDW 66/44/0347 12.4  11.5 - 15.5 % Final  . Platelets 12/09/2011 143* 150 - 400 K/uL Final  . Glucose-Capillary 12/09/2011 204* 70 - 99 mg/dL Final  . Specimen Description 12/09/2011 URINE, CLEAN CATCH   Final  . Special Requests 12/09/2011 NONE   Final  . Culture  Setup Time 12/09/2011 12/09/2011 19:09   Final  . Colony Count 12/09/2011 NO GROWTH   Final  . Culture 12/09/2011 NO GROWTH   Final  . Report Status 12/09/2011  12/10/2011 FINAL   Final  . Sodium 12/09/2011 131* 135 - 145 mEq/L  Final  . Potassium 12/09/2011 3.3* 3.5 - 5.1 mEq/L Final  . Chloride 12/09/2011 95* 96 - 112 mEq/L Final  . CO2 12/09/2011 24  19 - 32 mEq/L Final  . Glucose, Bld 12/09/2011 218* 70 - 99 mg/dL Final  . BUN 11/91/4782 18  6 - 23 mg/dL Final  . Creatinine, Ser 12/09/2011 1.42* 0.50 - 1.35 mg/dL Final  . Calcium 95/62/1308 8.6  8.4 - 10.5 mg/dL Final  . GFR calc non Af Amer 12/09/2011 49* >90 mL/min Final  . GFR calc Af Amer 12/09/2011 57* >90 mL/min Final   Comment:                                 The eGFR has been calculated                          using the CKD EPI equation.                          This calculation has not been                          validated in all clinical                          situations.                          eGFR's persistently                          <90 mL/min signify                          possible Chronic Kidney Disease.  . Glucose-Capillary 12/09/2011 210* 70 - 99 mg/dL Final  . Glucose-Capillary 12/09/2011 172* 70 - 99 mg/dL Final  . Comment 1 65/78/4696 Documented in Chart   Final  . Comment 2 12/09/2011 Notify RN   Final  . Glucose-Capillary 12/09/2011 222* 70 - 99 mg/dL Final  . Comment 1 29/52/8413 Documented in Chart   Final  . Comment 2 12/09/2011 Notify RN   Final  . Glucose-Capillary 12/10/2011 254* 70 - 99 mg/dL Final  . WBC 24/40/1027 12.1* 4.0 - 10.5 K/uL Final  . RBC 12/10/2011 2.42* 4.22 - 5.81 MIL/uL Final  . Hemoglobin 12/10/2011 7.8* 13.0 - 17.0 g/dL Final  . HCT 25/36/6440 22.8* 39.0 - 52.0 % Final  . MCV 12/10/2011 94.2  78.0 - 100.0 fL Final  . MCH 12/10/2011 32.2  26.0 - 34.0 pg Final  . MCHC 12/10/2011 34.2  30.0 - 36.0 g/dL Final  . RDW 34/74/2595 12.4  11.5 - 15.5 % Final  . Platelets 12/10/2011 201  150 - 400 K/uL Final   Comment: DELTA CHECK NOTED                          REPEATED TO VERIFY  . Sodium 12/10/2011 133* 135 - 145 mEq/L Final  .  Potassium 12/10/2011 3.3* 3.5 - 5.1 mEq/L Final  . Chloride 12/10/2011 97  96 - 112 mEq/L Final  . CO2 12/10/2011 23  19 - 32 mEq/L Final  . Glucose, Bld 12/10/2011 257* 70 -  99 mg/dL Final  . BUN 16/11/9602 18  6 - 23 mg/dL Final  . Creatinine, Ser 12/10/2011 1.30  0.50 - 1.35 mg/dL Final  . Calcium 54/10/8117 8.2* 8.4 - 10.5 mg/dL Final  . GFR calc non Af Amer 12/10/2011 54* >90 mL/min Final  . GFR calc Af Amer 12/10/2011 63* >90 mL/min Final   Comment:                                 The eGFR has been calculated                          using the CKD EPI equation.                          This calculation has not been                          validated in all clinical                          situations.                          eGFR's persistently                          <90 mL/min signify                          possible Chronic Kidney Disease.  . Glucose-Capillary 12/10/2011 258* 70 - 99 mg/dL Final  . Comment 1 14/78/2956 Documented in Chart   Final  . Comment 2 12/10/2011 Notify RN   Final  . Sodium 12/11/2011 130* 135 - 145 mEq/L Final  . Potassium 12/11/2011 3.7  3.5 - 5.1 mEq/L Final  . Chloride 12/11/2011 97  96 - 112 mEq/L Final  . CO2 12/11/2011 22  19 - 32 mEq/L Final  . Glucose, Bld 12/11/2011 293* 70 - 99 mg/dL Final  . BUN 21/30/8657 15  6 - 23 mg/dL Final  . Creatinine, Ser 12/11/2011 1.24  0.50 - 1.35 mg/dL Final  . Calcium 84/69/6295 8.2* 8.4 - 10.5 mg/dL Final  . GFR calc non Af Amer 12/11/2011 58* >90 mL/min Final  . GFR calc Af Amer 12/11/2011 67* >90 mL/min Final   Comment:                                 The eGFR has been calculated                          using the CKD EPI equation.                          This calculation has not been                          validated in all clinical                          situations.  eGFR's persistently                          <90 mL/min signify                          possible  Chronic Kidney Disease.  . WBC 12/11/2011 10.5  4.0 - 10.5 K/uL Final  . RBC 12/11/2011 2.28* 4.22 - 5.81 MIL/uL Final  . Hemoglobin 12/11/2011 7.4* 13.0 - 17.0 g/dL Final  . HCT 16/11/9602 21.5* 39.0 - 52.0 % Final  . MCV 12/11/2011 94.3  78.0 - 100.0 fL Final  . MCH 12/11/2011 32.5  26.0 - 34.0 pg Final  . MCHC 12/11/2011 34.4  30.0 - 36.0 g/dL Final  . RDW 54/10/8117 12.4  11.5 - 15.5 % Final  . Platelets 12/11/2011 221  150 - 400 K/uL Final  . Glucose-Capillary 12/10/2011 295* 70 - 99 mg/dL Final  . Comment 1 14/78/2956 Documented in Chart   Final  . Comment 2 12/10/2011 Notify RN   Final  . Glucose-Capillary 12/10/2011 252* 70 - 99 mg/dL Final  . Comment 1 21/30/8657 Notify RN   Final  . Comment 2 12/10/2011 Documented in Chart   Final  . Glucose-Capillary 12/11/2011 329* 70 - 99 mg/dL Final  Hospital Outpatient Visit on 12/01/2011  Component Date Value Range Status  . aPTT 12/01/2011 32  24 - 37 seconds Final  . Sodium 12/01/2011 135  135 - 145 mEq/L Final  . Potassium 12/01/2011 4.4  3.5 - 5.1 mEq/L Final  . Chloride 12/01/2011 101  96 - 112 mEq/L Final  . CO2 12/01/2011 24  19 - 32 mEq/L Final  . Glucose, Bld 12/01/2011 84  70 - 99 mg/dL Final  . BUN 84/69/6295 15  6 - 23 mg/dL Final  . Creatinine, Ser 12/01/2011 1.35  0.50 - 1.35 mg/dL Final  . Calcium 28/41/3244 9.6  8.4 - 10.5 mg/dL Final  . Total Protein 12/01/2011 8.5* 6.0 - 8.3 g/dL Final  . Albumin 02/22/7251 4.1  3.5 - 5.2 g/dL Final  . AST 66/44/0347 25  0 - 37 U/L Final  . ALT 12/01/2011 18  0 - 53 U/L Final  . Alkaline Phosphatase 12/01/2011 58  39 - 117 U/L Final  . Total Bilirubin 12/01/2011 0.4  0.3 - 1.2 mg/dL Final  . GFR calc non Af Amer 12/01/2011 52* >90 mL/min Final  . GFR calc Af Amer 12/01/2011 60* >90 mL/min Final   Comment:                                 The eGFR has been calculated                          using the CKD EPI equation.                          This calculation has not been                           validated in all clinical                          situations.  eGFR's persistently                          <90 mL/min signify                          possible Chronic Kidney Disease.  Marland Kitchen Prothrombin Time 12/01/2011 13.3  11.6 - 15.2 seconds Final  . INR 12/01/2011 1.02  0.00 - 1.49 Final  . Color, Urine 12/01/2011 Peter Bowen* YELLOW Final   BIOCHEMICALS MAY BE AFFECTED BY COLOR  . APPearance 12/01/2011 CLEAR  CLEAR Final  . Specific Gravity, Urine 12/01/2011 1.022  1.005 - 1.030 Final  . pH 12/01/2011 5.0  5.0 - 8.0 Final  . Glucose, UA 12/01/2011 100* NEGATIVE mg/dL Final  . Hgb urine dipstick 12/01/2011 NEGATIVE  NEGATIVE Final  . Bilirubin Urine 12/01/2011 SMALL* NEGATIVE Final  . Ketones, ur 12/01/2011 TRACE* NEGATIVE mg/dL Final  . Protein, ur 16/11/9602 NEGATIVE  NEGATIVE mg/dL Final  . Urobilinogen, UA 12/01/2011 1.0  0.0 - 1.0 mg/dL Final  . Nitrite 54/10/8117 NEGATIVE  NEGATIVE Final  . Leukocytes, UA 12/01/2011 NEGATIVE  NEGATIVE Final   MICROSCOPIC NOT DONE ON URINES WITH NEGATIVE PROTEIN, BLOOD, LEUKOCYTES, NITRITE, OR GLUCOSE <1000 mg/dL.  Marland Kitchen MRSA, PCR 12/01/2011 NEGATIVE  NEGATIVE Final  . Staphylococcus aureus 12/01/2011 NEGATIVE  NEGATIVE Final   Comment:                                 The Xpert SA Assay (FDA                          approved for NASAL specimens                          in patients over 72 years of age),                          is one component of                          a comprehensive surveillance                          program.  Test performance has                          been validated by [employer] for patients greater                          than or equal to 55 year old.                          It is not intended                          to diagnose infection nor to                          guide  or monitor treatment.  . WBC 12/01/2011 5.5  4.0 - 10.5 K/uL Final  . RBC  12/01/2011 3.88* 4.22 - 5.81 MIL/uL Final  . Hemoglobin 12/01/2011 12.5* 13.0 - 17.0 g/dL Final  . HCT 16/11/9602 37.0* 39.0 - 52.0 % Final  . MCV 12/01/2011 95.4  78.0 - 100.0 fL Final  . MCH 12/01/2011 32.2  26.0 - 34.0 pg Final  . MCHC 12/01/2011 33.8  30.0 - 36.0 g/dL Final  . RDW 54/10/8117 12.4  11.5 - 15.5 % Final  . Platelets 12/01/2011 236  150 - 400 K/uL Final     X-Rays:Dg Chest 2 View  12/10/2011  *RADIOLOGY REPORT*  Clinical Data: Fever, confusion  CHEST - 2 VIEW  Comparison: 12/01/2011  Findings: Cardiomediastinal silhouette is stable.  No acute infiltrate or pleural effusion.  No pulmonary edema.  Again noted mild degenerative changes thoracic spine.  IMPRESSION: No active disease.  No significant change.   Original Report Authenticated By: Natasha Mead, M.D.    Dg Chest 2 View  12/01/2011  *RADIOLOGY REPORT*  Clinical Data: Preop respiratory exam.  Diabetes and hypertension.  CHEST - 2 VIEW  Comparison: None.  Findings: Mild cardiomegaly is seen as well as ectasia of the thoracic aorta.  Both lungs are clear.  No evidence of pleural effusion.  No mass or lymphadenopathy identified.  IMPRESSION: Mild cardiomegaly.  No active lung disease.   Original Report Authenticated By: Danae Orleans, M.D.    Dg Knee Left Port  12/06/2011  *RADIOLOGY REPORT*  Clinical Data: Post left total knee replacement  PORTABLE LEFT KNEE - 1-2 VIEW  Comparison: Portable exam 1448 hours without priors for comparison.  Findings: Components of left knee prosthesis in expected positions. No fracture, dislocation, or bone destruction. Scattered bedding and external artifacts. Surgical drain anteriorly extending to the lateral gutter. Expected soft tissue changes. No periprosthetic lucency. Minimal atherosclerotic calcifications superficial femoral artery.  IMPRESSION: Left knee prosthesis without acute complication.   Original Report Authenticated By: Lollie Marrow, M.D.     Hospital Course:  Peter Bowen. is a 71 y.o. who was admitted to [facility]. They were brought to the operating room on 12/06/2011 and underwent Procedure(s): TOTAL KNEE ARTHROPLASTY.  Patient tolerated the procedure well and was later transferred to the recovery room and then to the orthopaedic floor for postoperative care.  They were given PO and IV analgesics for pain control following their surgery.  They were given 24 hours of postoperative antibiotics of  Anti-infectives     Start     Dose/Rate Route Frequency Ordered Stop   12/11/11 1000   cefTRIAXone (ROCEPHIN) 1 g in dextrose 5 % 50 mL IVPB        1 g 100 mL/hr over 30 Minutes Intravenous Every 24 hours 12/10/11 1217     12/10/11 1230   cefTRIAXone (ROCEPHIN) 1 g in dextrose 5 % 50 mL IVPB        1 g 100 mL/hr over 30 Minutes Intravenous NOW 12/10/11 1215 12/10/11 1505   12/09/11 1000   cefTRIAXone (ROCEPHIN) 1 g in dextrose 5 % 50 mL IVPB        1 g 100 mL/hr over 30 Minutes Intravenous  Once 12/09/11 0906 12/09/11 1115   12/06/11 1730   ceFAZolin (ANCEF) IVPB 1 g/50 mL premix        1 g 100 mL/hr over 30 Minutes Intravenous Every 6 hours 12/06/11 1710 12/06/11 2350   12/06/11 1138  polymyxin B 500,000 Units, bacitracin 50,000 Units in sodium chloride irrigation 0.9 % 500 mL irrigation  Status:  Discontinued          As needed 12/06/11 1138 12/06/11 1438   12/06/11 0856   ceFAZolin (ANCEF) IVPB 2 g/50 mL premix        2 g 100 mL/hr over 30 Minutes Intravenous 60 min pre-op 12/06/11 0856 12/06/11 1115         and started on DVT prophylaxis in the form of Xarelto.   PT and OT were ordered for total joint protocol.  Discharge planning consulted to help with postop disposition and equipment needs. Patient had a decent night on the evening of surgery and started to get up OOB with therapy on day one.  Hemovac drain was pulled without difficulty.  Had a high temperature but no erythema about the knee. Patient had significant cough. Incentive  spirometry was encouraged. Continued to work with therapy into day two.  Dressing was changed on day two and the incision was clean and dry .  By day three, the patient was progressing slowly and the wife decided that she would be unable to care for him at home, therefore requesting SNF.  Incision was healing well. Patient developed a UTI which may have explained elevated temperature and change in mental status. He was given IV Rocephin. CXR showed no active disease. Post op day 5, the patient was seen and HbB dropped to 7.4. Two units of blood ordered for transfusion. Post op day 5 temperature 98.5 F. Plan was for discharge to SNF on post op day 6.   Discharge Medications: Prior to Admission medications   Medication Sig Start Date End Date Taking? Authorizing Provider  amLODipine-benazepril (LOTREL) 10-20 MG per capsule Take 1 capsule by mouth daily with breakfast.   Yes Historical Provider, MD  glimepiride (AMARYL) 4 MG tablet Take 4 mg by mouth 2 (two) times daily.   Yes Historical Provider, MD  metoprolol succinate (TOPROL-XL) 50 MG 24 hr tablet Take 50 mg by mouth daily with breakfast. Take with or immediately following a meal.   Yes Historical Provider, MD  sitaGLIPtan-metformin (JANUMET) 50-1000 MG per tablet Take 1 tablet by mouth 2 (two) times daily with a meal.   Yes Historical Provider, MD  tiZANidine (ZANAFLEX) 4 MG tablet Take 4 mg by mouth every 6 (six) hours as needed. For pain   Yes Historical Provider, MD  ferrous sulfate 325 (65 FE) MG tablet Take 1 tablet (325 mg total) by mouth 3 (three) times daily after meals. 12/08/11   Rogerio Boutelle Tamala Ser, PA  Insulin Aspart Prot & Aspart (NOVOLOG MIX 70/30 FLEXPEN [place]) Inject 30 Units into the skin 3 (three) times daily as needed.    Historical Provider, MD  methocarbamol (ROBAXIN) 500 MG tablet Take 1 tablet (500 mg total) by mouth every 6 (six) hours as needed. 12/08/11   Ifeoluwa Bartz Tamala Ser, PA  oxyCODONE-acetaminophen (PERCOCET/ROXICET)  5-325 MG per tablet Take 1-2 tablets by mouth every 4 (four) hours as needed (Q4-6 hours PRN). 12/08/11   Jasha Hodzic Tamala Ser, PA  polyethylene glycol (MIRALAX / GLYCOLAX) packet Take 17 g by mouth daily as needed. 12/08/11   Chez Bulnes Tamala Ser, PA  rivaroxaban (XARELTO) 10 MG TABS tablet Take 1 tablet (10 mg total) by mouth daily with breakfast. 12/08/11   Kerby Nora, PA    Diet: Diabetic diet Activity:WBAT Follow-up: on Friday October 25 Disposition - Skilled nursing facility Discharged Condition: fair  Discharge Orders    Future Orders Please Complete By Expires   Diet - low sodium heart healthy      Call MD / Call 911      Comments:   If you experience chest pain or shortness of breath, CALL 911 and be transported to the hospital emergency room.  If you develope a fever above 101 F, pus (white drainage) or increased drainage or redness at the wound, or calf pain, call your surgeon's office.   Constipation Prevention      Comments:   Drink plenty of fluids.  Prune juice may be helpful.  You may use a stool softener, such as Colace (over the counter) 100 mg twice a day.  Use MiraLax (over the counter) for constipation as needed.   Increase activity slowly as tolerated      Discharge instructions      Comments:   Walk with your walker. Weight bearing as instructed. Home Health Agency will follow you at home for your therapy  Change your dressing daily. Shower only, no tub bath. Call if any temperatures greater than 101 or any wound complications: [phone] during the day and ask for Dr. Jeannetta Ellis nurse, [facility]. May resume vitamins, supplements, and aspirin after completion of three week course of Xarelto Follow up in office in 2 weeks   Driving restrictions      Comments:   No driving for 2 weeks   Do not put a pillow under the knee. Place it under the heel.          Medication List     As of 12/11/2011 12:25 PM    STOP taking these medications           acetaminophen 500 MG tablet   Commonly known as: TYLENOL      aspirin 325 MG tablet      CENTRUM SILVER ADULT 50+ Tabs      traMADol 50 MG tablet   Commonly known as: ULTRAM      TAKE these medications         amLODipine-benazepril 10-20 MG per capsule   Commonly known as: LOTREL   Take 1 capsule by mouth daily with breakfast.      ferrous sulfate 325 (65 FE) MG tablet   Take 1 tablet (325 mg total) by mouth 3 (three) times daily after meals.      glimepiride 4 MG tablet   Commonly known as: AMARYL   Take 4 mg by mouth 2 (two) times daily.      methocarbamol 500 MG tablet   Commonly known as: ROBAXIN   Take 1 tablet (500 mg total) by mouth every 6 (six) hours as needed.      metoprolol succinate 50 MG 24 hr tablet   Commonly known as: TOPROL-XL   Take 50 mg by mouth daily with breakfast. Take with or immediately following a meal.      NOVOLOG MIX 70/30 FLEXPEN [place]   Inject 30 Units into the skin 3 (three) times daily as needed.      oxyCODONE-acetaminophen 5-325 MG per tablet   Commonly known as: PERCOCET/ROXICET   Take 1-2 tablets by mouth every 4 (four) hours as needed (Q4-6 hours PRN).      polyethylene glycol packet   Commonly known as: MIRALAX / GLYCOLAX   Take 17 g by mouth daily as needed.      rivaroxaban 10 MG Tabs tablet   Commonly known as: XARELTO   Take 1  tablet (10 mg total) by mouth daily with breakfast.      sitaGLIPtan-metformin 50-1000 MG per tablet   Commonly known as: JANUMET   Take 1 tablet by mouth 2 (two) times daily with a meal.      tiZANidine 4 MG tablet   Commonly known as: ZANAFLEX   Take 4 mg by mouth every 6 (six) hours as needed. For pain      Pt will be on Cipro 500mg  BID for one week for treatment of UTI   Signed: Day Greb LAUREN 12/11/2011, 12:25 PM

## 2011-12-11 NOTE — Progress Notes (Signed)
CSW assisting with d/c planning. SNF bed offers available. Message left for spouse to contact CSW for assistance with d/c planning.  Cori Razor LCSW [phone]

## 2011-12-12 LAB — TYPE AND SCREEN
ABO/RH(D): B POS
Antibody Screen: NEGATIVE
Unit division: 0
Unit division: 0

## 2011-12-12 LAB — BASIC METABOLIC PANEL
CO2: 21 mEq/L (ref 19–32)
Chloride: 98 mEq/L (ref 96–112)
Creatinine, Ser: 1.17 mg/dL (ref 0.50–1.35)
Potassium: 3.6 mEq/L (ref 3.5–5.1)
Sodium: 132 mEq/L — ABNORMAL LOW (ref 135–145)

## 2011-12-12 LAB — CBC
HCT: 24.5 % — ABNORMAL LOW (ref 39.0–52.0)
Hemoglobin: 8.4 g/dL — ABNORMAL LOW (ref 13.0–17.0)
MCV: 91.4 fL (ref 78.0–100.0)
RBC: 2.68 MIL/uL — ABNORMAL LOW (ref 4.22–5.81)
WBC: 10.5 10*3/uL (ref 4.0–10.5)

## 2011-12-12 LAB — GLUCOSE, CAPILLARY
Glucose-Capillary: 237 mg/dL — ABNORMAL HIGH (ref 70–99)
Glucose-Capillary: 259 mg/dL — ABNORMAL HIGH (ref 70–99)

## 2011-12-12 MED ORDER — FERROUS SULFATE 325 (65 FE) MG PO TABS: 325.0000 mg | ORAL_TABLET | Freq: Three times a day (TID) | ORAL | Status: DC

## 2011-12-12 MED ORDER — CIPROFLOXACIN HCL 500 MG PO TABS: 500.0000 mg | ORAL_TABLET | Freq: Two times a day (BID) | ORAL | Status: DC

## 2011-12-12 MED ORDER — METHOCARBAMOL 500 MG PO TABS: 500.0000 mg | ORAL_TABLET | Freq: Four times a day (QID) | ORAL | Status: DC | PRN

## 2011-12-12 MED ORDER — RIVAROXABAN 10 MG PO TABS: 10.0000 mg | ORAL_TABLET | Freq: Every day | ORAL | Status: DC

## 2011-12-12 MED ORDER — POLYETHYLENE GLYCOL 3350 17 G PO PACK: 17.0000 g | PACK | Freq: Every day | ORAL | Status: DC | PRN

## 2011-12-12 MED ORDER — OXYCODONE-ACETAMINOPHEN 5-325 MG PO TABS: 1.0000 | ORAL_TABLET | ORAL | Status: DC | PRN

## 2011-12-12 NOTE — Progress Notes (Signed)
Peter Bowen and his wife meet with myself and Dr. Darrelyn Hillock in the office prior to his left total knee arthroplasty. The requirements of his care after surgery were discussed and his wife reported that she was more than capable of taking care of him. Therefore, we arranged for him to go home after his hospital stay. His discharge arrangements were made for Saturday December 09, 2011 on Friday December 08, 2011 so that the on call doctor and PA could easily transition him home. On Saturday however, he became confused and developed a UTI. His wife then felt that she would not be able to take care of him and asked for SNF placement. This caused his hospital stay to be delayed until at least Monday while arrangements for SNF placement could be made. The patient was seen on Monday but his Hgb had dropped to 7.4, requiring a blood transfusion of two units. This delayed his stay until Tuesday December 12, 2011. He was seen on the morning of October 22 and arrangements were made for transfer to SNF, specifically [facility]. The wife then changed her mind and decided that she could care for her husband at home. Therefore, he should be discharged home on Tuesday December 12, 2011 with home health and PT.     Dimitri Ped, PA-C

## 2011-12-12 NOTE — Progress Notes (Signed)
SNF bed available at [facility] today. CSW will assist with d/c planning to SNF.  Cori Razor LCSW [phone]

## 2011-12-12 NOTE — Progress Notes (Addendum)
Occupational Therapy Treatment Patient Details Name: Peter Bowen. MRN: [id number] DOB: 1940-12-14 Today's Date: 12/12/2011 Time: 7829-5621 OT Time Calculation (min): 8 min  OT Assessment / Plan / Recommendation Comments on Treatment Session      Follow Up Recommendations   HHOT    Barriers to Discharge       Equipment Recommendations  Rolling walker with 5" wheels;3 in 1 bedside comode    Recommendations for Other Services    Frequency     Plan      Precautions / Restrictions Precautions Precautions: Knee;Fall Restrictions LLE Weight Bearing: Weight bearing as tolerated   Pertinent Vitals/Pain No c/o pain    ADL  ADL Comments: Educated wife about 3;1 commode uses (demonstrated over toilet and in shower).  Demonstrated proper sequence of performing shower transfer, and reinforced that saran wrap is wrapped around knee and brace off while sitting in shower.  Pt did not want to practice shower transfer and wife feels they will be OK  Handout provided   OT Diagnosis:    OT Problem List:   OT Treatment Interventions:     OT Goals ADL Goals Pt Will Perform Tub/Shower Transfer: Shower transfer;with min assist;Ambulation ADL Goal: Tub/Shower Transfer - Progress: Other (comment) (pt and wife verbalize sequence; handout given)  Visit Information  Last OT Received On: 12/12/11 Assistance Needed: +1    Subjective Data      Prior Functioning       Cognition       Mobility  Shoulder Instructions         Exercises      Balance     End of Session  Left in chair with family present and call light within reach  GO     Peter Bowen 12/12/2011, 11:26 AM Marica Otter, OTR/L [phone] 12/12/2011

## 2011-12-12 NOTE — Progress Notes (Signed)
Physical Therapy Treatment Patient Details Name: Peter Bowen. MRN: [id number] DOB: 1940/10/02 Today's Date: 12/12/2011 Time: 7829-5621 PT Time Calculation (min): 11 min  PT Assessment / Plan / Recommendation Comments on Treatment Session  Pt ambulated and performed 3 steps.  Pt attempted to use walking stick on stairs and required cues to use only rails for safety.  Spouse present for tx and believes she can manage pt at home.  Pt now wants to d/c home instead of SNF.    Follow Up Recommendations  Post acute inpatient     Does the patient have the potential to tolerate intense rehabilitation  No, Recommend SNF  Barriers to Discharge        Equipment Recommendations  Rolling walker with 5" wheels;3 in 1 bedside comode    Recommendations for Other Services    Frequency     Plan Discharge plan remains appropriate;Frequency remains appropriate    Precautions / Restrictions Precautions Precautions: Knee;Fall Required Braces or Orthoses: Knee Immobilizer - Left Knee Immobilizer - Left: Discontinue once straight leg raise with < 10 degree lag Restrictions LLE Weight Bearing: Weight bearing as tolerated   Pertinent Vitals/Pain 5/10 L knee pain, premedicated, repositioned    Mobility  Transfers Transfers: Sit to Stand;Stand to Sit Sit to Stand: 4: Min guard;With upper extremity assist;From chair/3-in-1 Stand to Sit: 4: Min guard;To chair/3-in-1;With upper extremity assist Details for Transfer Assistance: verbal cues for safe technique Ambulation/Gait Ambulation/Gait Assistance: 4: Min guard Ambulation Distance (Feet): 60 Feet Assistive device: Rolling walker Ambulation/Gait Assistance Details: verbal cues for posture and safe RW distance Gait Pattern: Step-to pattern;Decreased stance time - left;Trunk flexed;Decreased weight shift to left;Antalgic Stairs: Yes Stairs Assistance: 4: Min assist Stairs Assistance Details (indicate cue type and reason): pt and spouse  educated in sequence for steps with 2 rails, pt attempted to use walking stick however unsafe with AD so made pt use stair rails Stair Management Technique: Two rails;Step to pattern;Forwards Number of Stairs: 3     Exercises     PT Diagnosis:    PT Problem List:   PT Treatment Interventions:     PT Goals Acute Rehab PT Goals PT Goal: Sit to Stand - Progress: Progressing toward goal PT Goal: Stand to Sit - Progress: Progressing toward goal PT Goal: Ambulate - Progress: Progressing toward goal  Visit Information  Last PT Received On: 12/12/11 Assistance Needed: +1    Subjective Data  Subjective: 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   Cognition  Overall Cognitive Status: Impaired Area of Impairment: Safety/judgement Arousal/Alertness: Awake/alert Orientation Level: Appears intact for tasks assessed Safety/Judgement: Decreased awareness of safety precautions;Decreased safety judgement for tasks assessed;Impulsive    Balance     End of Session PT - End of Session Equipment Utilized During Treatment: Left knee immobilizer Activity Tolerance: Patient tolerated treatment well Patient left: in chair;with call bell/phone within reach;with family/visitor present   GP     Nicki Furlan,KATHrine E 12/12/2011, 12:32 PM Pager: [phone]

## 2011-12-12 NOTE — Progress Notes (Signed)
Discharged pt to family auto via wheelchair. Pt impatient this am as he had to wait for DME. Wife at bedside. Assessment remains unchanged from am.

## 2011-12-12 NOTE — Progress Notes (Signed)
Subjective: Dressing changed and wound looks fine. Temp. 101.1 and has been elevated periodically.Had transfusion yesterday and Hbg is now 8.4.will Dc.Urine was very cloudy and he had abnormal urinalysis and with intermittent Temp. Elevations,wil  DC on Cipro.   Objective: Vital signs in last 24 hours: Temp:  [99 F (37.2 C)-102.9 F (39.4 C)] 101.1 F (38.4 C) (10/22 0534) Pulse Rate:  [76-112] 112  (10/22 0534) Resp:  [16-18] 18  (10/22 0534) BP: (109-172)/(56-86) 172/75 mmHg (10/22 0534) SpO2:  [95 %-99 %] 97 % (10/22 0534)  Intake/Output from previous day: 10/21 0701 - 10/22 0700 In: 1410 [P.O.:360; I.V.:750; Blood:300] Out: 830 [Urine:830] Intake/Output this shift:     Basename 12/12/11 0436 12/11/11 0354 12/10/11 0926  HGB 8.4* 7.4* 7.8*    Basename 12/12/11 0436 12/11/11 0354  WBC 10.5 10.5  RBC 2.68* 2.28*  HCT 24.5* 21.5*  PLT 251 221    Basename 12/12/11 0436 12/11/11 0354  NA 132* 130*  K 3.6 3.7  CL 98 97  CO2 21 22  BUN 13 15  CREATININE 1.17 1.24  GLUCOSE 252* 293*  CALCIUM 8.5 8.2*   No results found for this basename: LABPT:2,INR:2 in the last 72 hours  No cellulitis present  Assessment/Plan: DC today on Xarelto and Cipro. Will follow closely in office. Will have his MD follow his BP.   Karliah Kowalchuk A 12/12/2011, 7:02 AM

## 2011-12-12 NOTE — Progress Notes (Signed)
Discharge summary sent to payer through [facility]  

## 2013-12-29 ENCOUNTER — Other Ambulatory Visit (HOSPITAL_BASED_OUTPATIENT_CLINIC_OR_DEPARTMENT_OTHER): Payer: Self-pay | Admitting: Internal Medicine

## 2013-12-29 ENCOUNTER — Ambulatory Visit (HOSPITAL_BASED_OUTPATIENT_CLINIC_OR_DEPARTMENT_OTHER)
Admission: RE | Admit: 2013-12-29 | Discharge: 2013-12-29 | Disposition: A | Discharge status: Home / Self Care | Payer: Medicare Other | Source: Ambulatory Visit | Attending: Internal Medicine | Admitting: Internal Medicine

## 2013-12-29 ENCOUNTER — Other Ambulatory Visit (HOSPITAL_BASED_OUTPATIENT_CLINIC_OR_DEPARTMENT_OTHER): Payer: Medicare Other

## 2013-12-29 DIAGNOSIS — M7989 Other specified soft tissue disorders: Secondary | ICD-10-CM | POA: Insufficient documentation

## 2013-12-29 DIAGNOSIS — M79604 Pain in right leg: Secondary | ICD-10-CM

## 2014-12-17 DIAGNOSIS — H5203 Hypermetropia, bilateral: Secondary | ICD-10-CM | POA: Diagnosis not present

## 2014-12-17 DIAGNOSIS — E119 Type 2 diabetes mellitus without complications: Secondary | ICD-10-CM | POA: Diagnosis not present

## 2014-12-17 DIAGNOSIS — H25013 Cortical age-related cataract, bilateral: Secondary | ICD-10-CM | POA: Diagnosis not present

## 2014-12-17 DIAGNOSIS — H2513 Age-related nuclear cataract, bilateral: Secondary | ICD-10-CM | POA: Diagnosis not present

## 2014-12-25 DIAGNOSIS — M170 Bilateral primary osteoarthritis of knee: Secondary | ICD-10-CM | POA: Diagnosis not present

## 2014-12-25 DIAGNOSIS — N3281 Overactive bladder: Secondary | ICD-10-CM | POA: Diagnosis not present

## 2014-12-25 DIAGNOSIS — Z0100 Encounter for examination of eyes and vision without abnormal findings: Secondary | ICD-10-CM | POA: Diagnosis not present

## 2014-12-25 DIAGNOSIS — I10 Essential (primary) hypertension: Secondary | ICD-10-CM | POA: Diagnosis not present

## 2014-12-25 DIAGNOSIS — Z87891 Personal history of nicotine dependence: Secondary | ICD-10-CM | POA: Diagnosis not present

## 2014-12-25 DIAGNOSIS — Z23 Encounter for immunization: Secondary | ICD-10-CM | POA: Diagnosis not present

## 2014-12-25 DIAGNOSIS — E119 Type 2 diabetes mellitus without complications: Secondary | ICD-10-CM | POA: Diagnosis not present

## 2015-02-05 DIAGNOSIS — I119 Hypertensive heart disease without heart failure: Secondary | ICD-10-CM | POA: Diagnosis not present

## 2015-02-05 DIAGNOSIS — I10 Essential (primary) hypertension: Secondary | ICD-10-CM | POA: Diagnosis not present

## 2015-02-28 IMAGING — US US EXTREM LOW VENOUS*R*
1 series · 13 of 24 positions shown · non-contrast
Comparison: None.

CLINICAL DATA: Right lower extremity swelling, 5 days duration.



[Series 1: us extrem low venous*right* · 0.10mm/px · 13 of 26 slices shown]
[im 1/26]
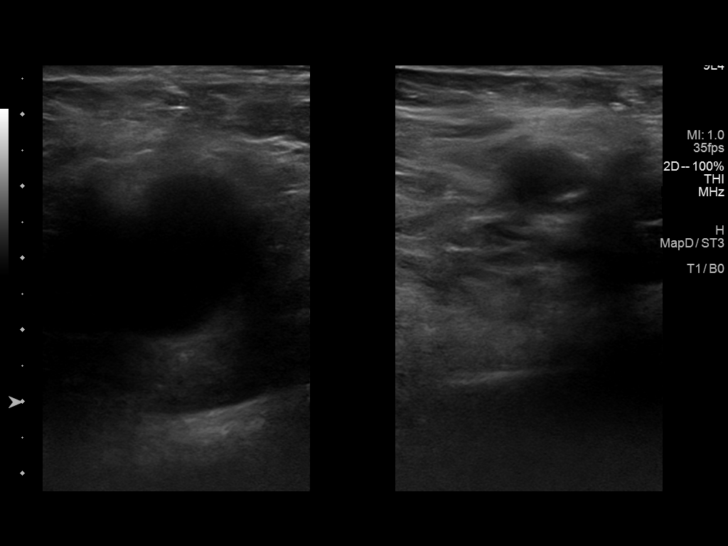
[im 3/26]
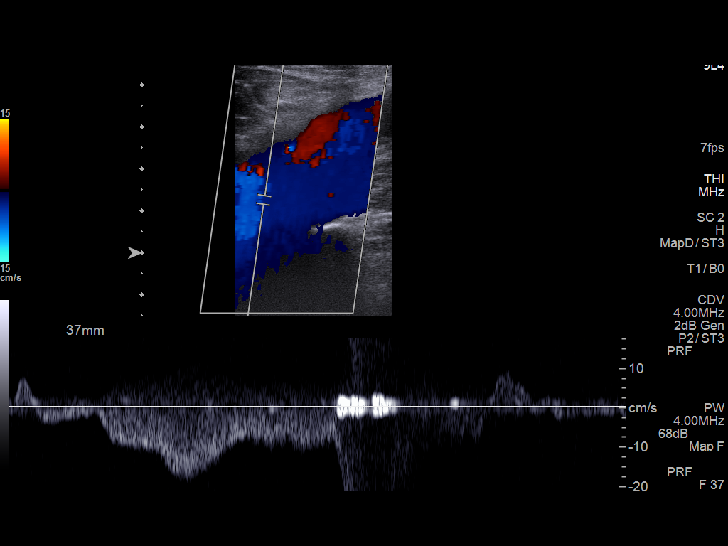
[im 5/26]
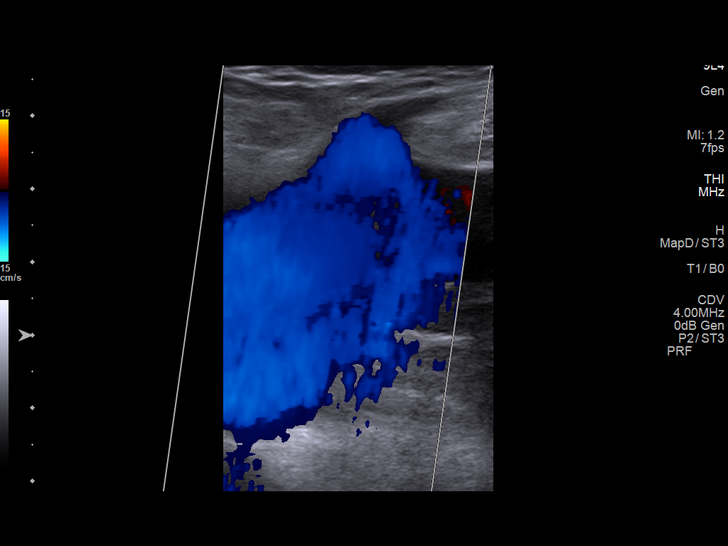
[im 7/26]
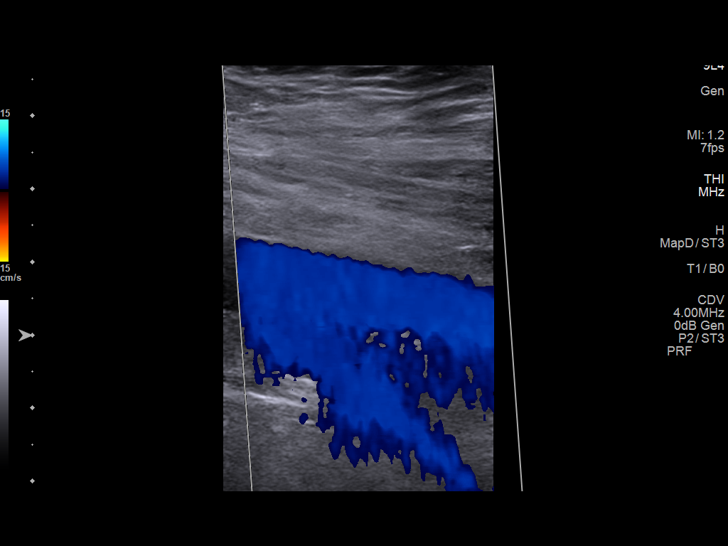
[im 9/26]
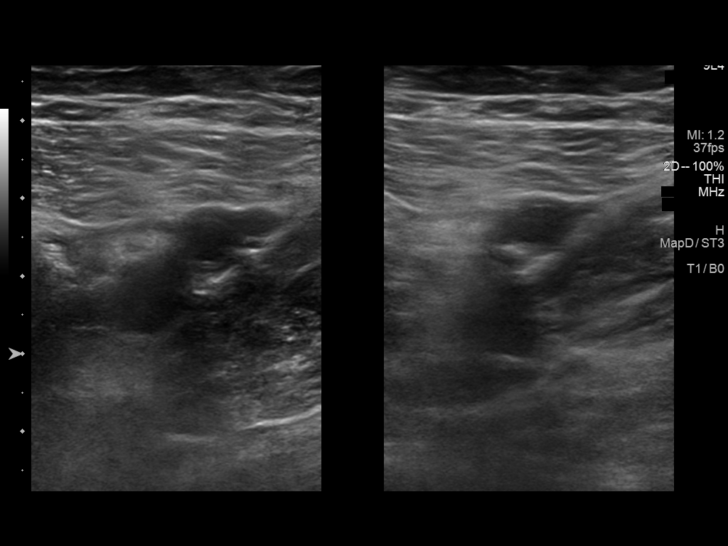
[im 11/26]
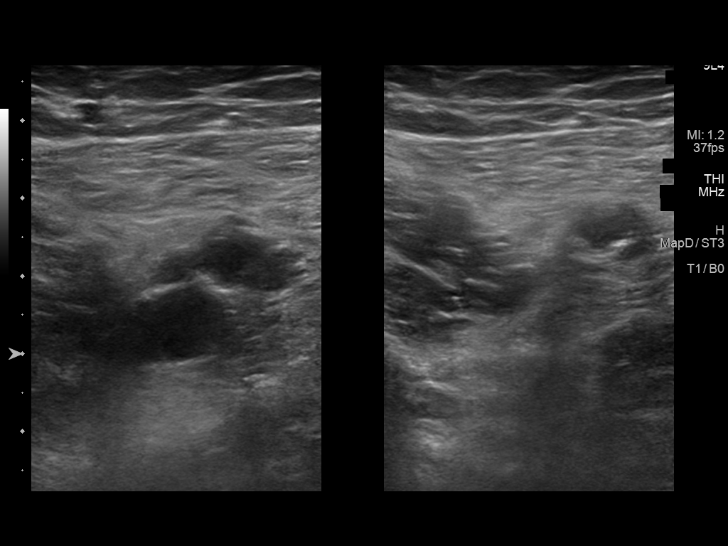
[im 14/26]
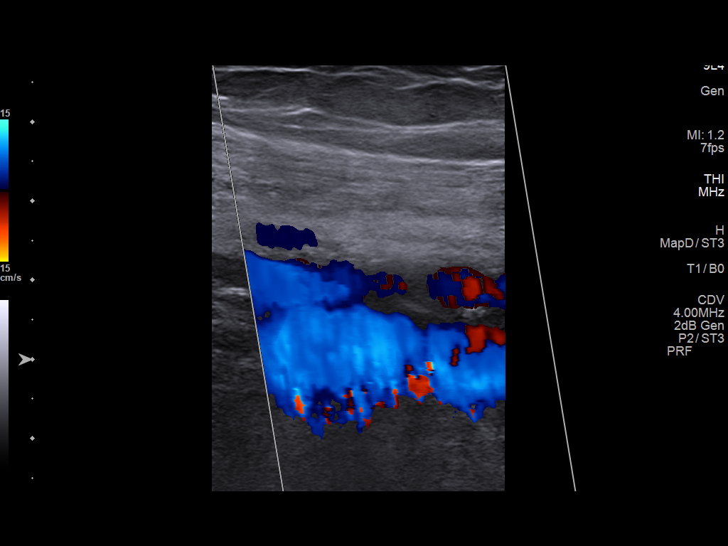
[im 15/26]
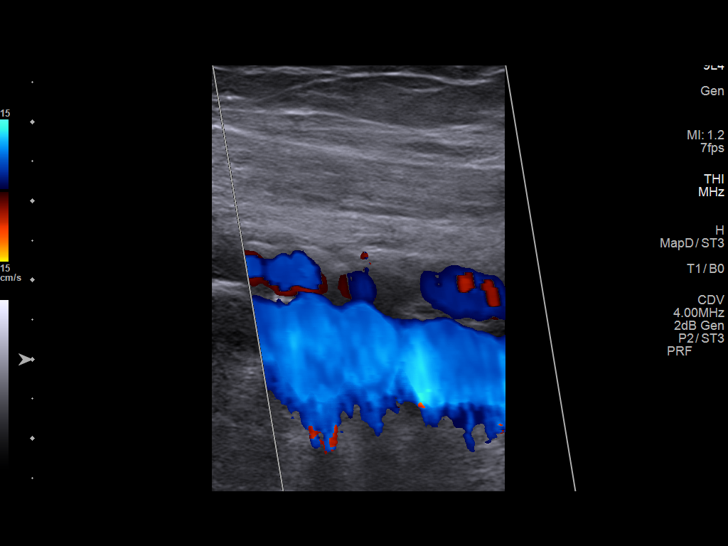
[im 17/26]
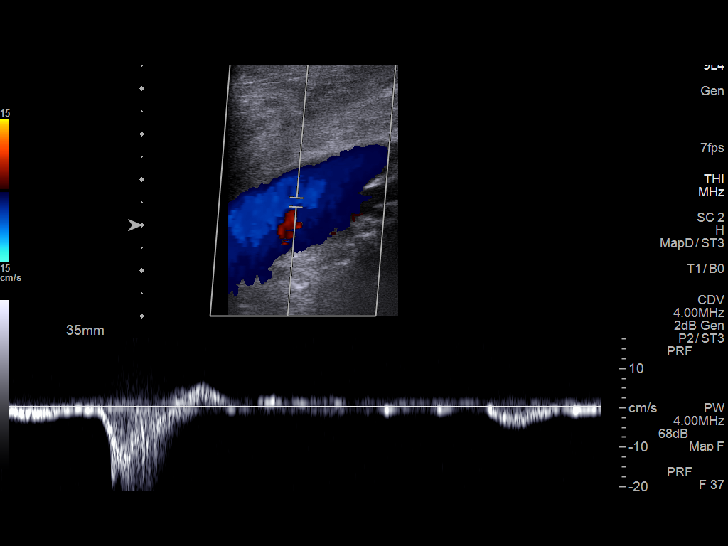
[im 19/26]
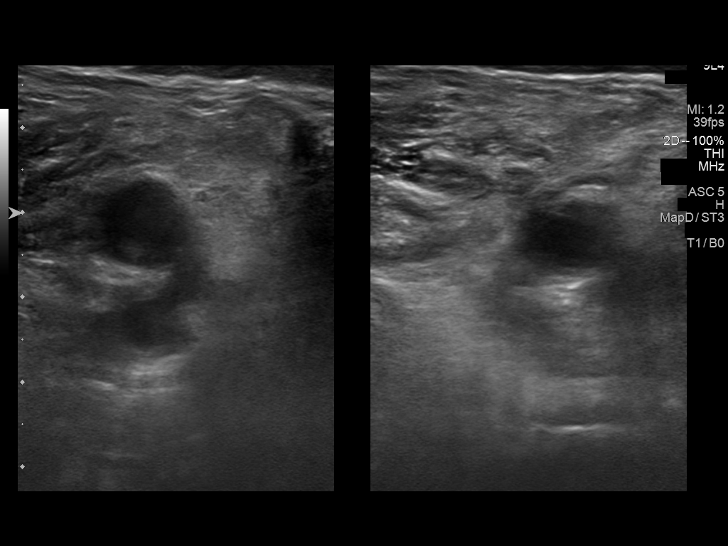
[im 21/26]
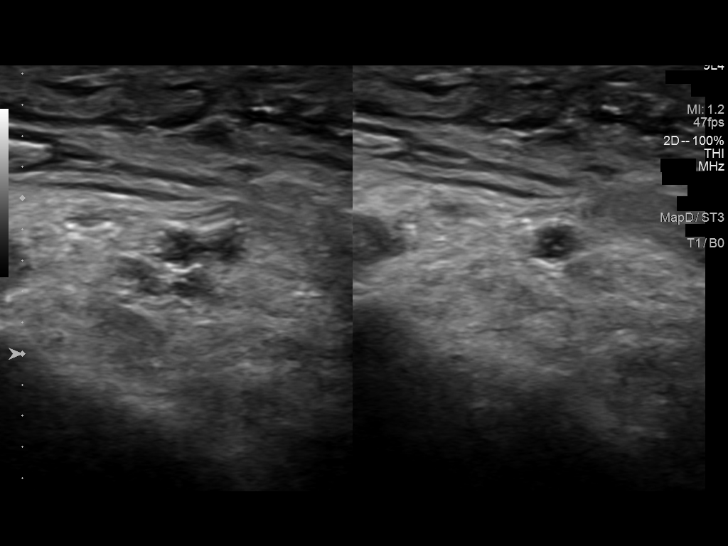
[im 23/26]
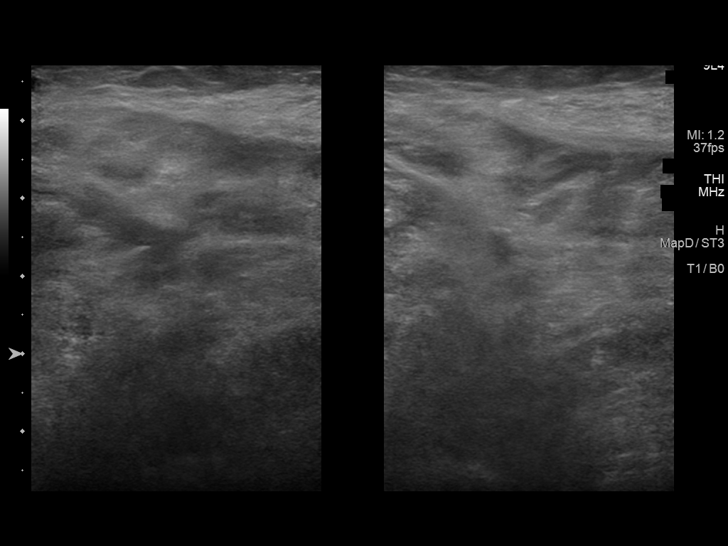
[im 26/26]
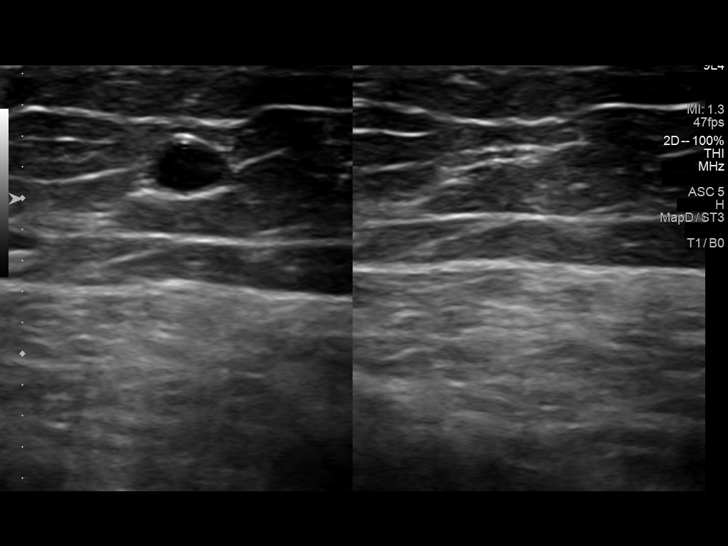

[13 of 24 positions shown; findings below may reference images not displayed]

FINDINGS: Contralateral Common Femoral Vein: Not imaged.

Common Femoral Vein: No evidence of thrombus. Normal
compressibility, respiratory phasicity and response to augmentation.

Saphenofemoral Junction: No evidence of thrombus. Normal
compressibility and flow on color Doppler imaging.

Profunda Femoral Vein: No evidence of thrombus. Normal
compressibility and flow on color Doppler imaging.

Femoral Vein: No evidence of thrombus. Normal compressibility,
respiratory phasicity and response to augmentation.

Popliteal Vein: No evidence of thrombus. Normal compressibility,
respiratory phasicity and response to augmentation.

Calf Veins: No evidence of thrombus. Normal compressibility and flow
on color Doppler imaging.

Superficial Great Saphenous Vein: No evidence of thrombus. Normal
compressibility and flow on color Doppler imaging.

Venous Reflux:  None.

Other Findings:  None.
IMPRESSION: No evidence of deep venous thrombosis.

## 2015-04-16 DIAGNOSIS — Z87891 Personal history of nicotine dependence: Secondary | ICD-10-CM | POA: Diagnosis not present

## 2015-04-16 DIAGNOSIS — M170 Bilateral primary osteoarthritis of knee: Secondary | ICD-10-CM | POA: Diagnosis not present

## 2015-04-16 DIAGNOSIS — E119 Type 2 diabetes mellitus without complications: Secondary | ICD-10-CM | POA: Diagnosis not present

## 2015-04-16 DIAGNOSIS — I10 Essential (primary) hypertension: Secondary | ICD-10-CM | POA: Diagnosis not present

## 2015-04-16 DIAGNOSIS — N3281 Overactive bladder: Secondary | ICD-10-CM | POA: Diagnosis not present

## 2015-05-10 DIAGNOSIS — R972 Elevated prostate specific antigen [PSA]: Secondary | ICD-10-CM | POA: Diagnosis not present

## 2015-05-10 DIAGNOSIS — N400 Enlarged prostate without lower urinary tract symptoms: Secondary | ICD-10-CM | POA: Diagnosis not present

## 2015-05-17 DIAGNOSIS — R972 Elevated prostate specific antigen [PSA]: Secondary | ICD-10-CM | POA: Diagnosis not present

## 2015-05-17 DIAGNOSIS — N400 Enlarged prostate without lower urinary tract symptoms: Secondary | ICD-10-CM | POA: Diagnosis not present

## 2015-06-10 DIAGNOSIS — Z125 Encounter for screening for malignant neoplasm of prostate: Secondary | ICD-10-CM | POA: Diagnosis not present

## 2015-06-10 DIAGNOSIS — M170 Bilateral primary osteoarthritis of knee: Secondary | ICD-10-CM | POA: Diagnosis not present

## 2015-06-10 DIAGNOSIS — Z87891 Personal history of nicotine dependence: Secondary | ICD-10-CM | POA: Diagnosis not present

## 2015-06-10 DIAGNOSIS — E119 Type 2 diabetes mellitus without complications: Secondary | ICD-10-CM | POA: Diagnosis not present

## 2015-06-10 DIAGNOSIS — I10 Essential (primary) hypertension: Secondary | ICD-10-CM | POA: Diagnosis not present

## 2015-06-10 DIAGNOSIS — M545 Low back pain: Secondary | ICD-10-CM | POA: Diagnosis not present

## 2015-07-13 DIAGNOSIS — N3281 Overactive bladder: Secondary | ICD-10-CM | POA: Diagnosis not present

## 2015-07-13 DIAGNOSIS — Z87891 Personal history of nicotine dependence: Secondary | ICD-10-CM | POA: Diagnosis not present

## 2015-07-13 DIAGNOSIS — I10 Essential (primary) hypertension: Secondary | ICD-10-CM | POA: Diagnosis not present

## 2015-07-13 DIAGNOSIS — E119 Type 2 diabetes mellitus without complications: Secondary | ICD-10-CM | POA: Diagnosis not present

## 2015-07-13 DIAGNOSIS — M170 Bilateral primary osteoarthritis of knee: Secondary | ICD-10-CM | POA: Diagnosis not present

## 2015-09-06 DIAGNOSIS — E119 Type 2 diabetes mellitus without complications: Secondary | ICD-10-CM | POA: Diagnosis not present

## 2015-09-27 DIAGNOSIS — Z01118 Encounter for examination of ears and hearing with other abnormal findings: Secondary | ICD-10-CM | POA: Diagnosis not present

## 2015-09-27 DIAGNOSIS — Z87891 Personal history of nicotine dependence: Secondary | ICD-10-CM | POA: Diagnosis not present

## 2015-09-27 DIAGNOSIS — E119 Type 2 diabetes mellitus without complications: Secondary | ICD-10-CM | POA: Diagnosis not present

## 2015-09-27 DIAGNOSIS — I10 Essential (primary) hypertension: Secondary | ICD-10-CM | POA: Diagnosis not present

## 2015-09-27 DIAGNOSIS — Z0000 Encounter for general adult medical examination without abnormal findings: Secondary | ICD-10-CM | POA: Diagnosis not present

## 2015-10-07 ENCOUNTER — Encounter: Payer: Self-pay | Admitting: Podiatry

## 2015-10-07 ENCOUNTER — Ambulatory Visit (INDEPENDENT_AMBULATORY_CARE_PROVIDER_SITE_OTHER): Payer: Medicare Other | Admitting: Podiatry

## 2015-10-07 VITALS — BP 157/87 | HR 83 | Ht 75.0 in | Wt 239.0 lb

## 2015-10-07 DIAGNOSIS — M79606 Pain in leg, unspecified: Secondary | ICD-10-CM

## 2015-10-07 DIAGNOSIS — B351 Tinea unguium: Secondary | ICD-10-CM

## 2015-10-07 NOTE — Progress Notes (Signed)
SUBJECTIVE: 75 y.o. year old male presents complaining of pain all over both feet off and on for a year. Not constant but sporadic pain. 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  Patient is referred by Dr. Vista Lawman.   Systems review: Reviewed medical record and noted of pertinent information. Positive for diabetic since 1997. 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 History of left knee surgery.   DERMATOLOGIC EXAMINATION: Thick dystrophic nails bilaterl with severe fungal debris on both great toes. Plantar porokeratotic lesion under 4th MPJ bilateral.  Digital corn 2nd right.  VASCULAR EXAMINATION OF LOWER LIMBS: Pedal pulses: Palpable DP and PT on right with normal strength, faintly palpable on left. Capillary Filling times within 3 seconds in all digits.  Temperature gradient from tibial crest to dorsum of foot is within normal bilateral.  NEUROLOGIC EXAMINATION OF THE LOWER LIMBS: Achilles DTR is present and within normal. Monofilament (Semmes-Weinstein 10-gm) sensory testing positive 6 out of 6, bilateral. Vibratory sensations(128Hz  turning fork) intact at medial and lateral forefoot bilateral.  Sharp and Dull discriminatory sensations at the plantar ball of hallux is intact bilateral.   MUSCULOSKELETAL EXAMINATION: Positive for deviated hallux right, contracted digit with digital corn 2nd right. Absent 3rd digit left.  ASSESSMENT: 1. Painful porokeratosis, corns and calluses bilateral. 2. Onychomycosis x 10. 3. 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 4. NIDDM under control.  PLAN: Reviewed clinical findings and available treatment options. All nails, corns, and calluses debrided. Bleeding 5th toe nail on right cleansed with iodine and dressed with compression bandage with home care instruction. Digital corn 2nd right debrided and padded with instruction to keep the pad for a week.  Return 3 months for routine foot are or sooner if needed.

## 2015-10-07 NOTE — Patient Instructions (Signed)
Seen for hypertrophic nails. All nails debrided. Return in 3 months or as needed.  

## 2015-12-13 DIAGNOSIS — E119 Type 2 diabetes mellitus without complications: Secondary | ICD-10-CM | POA: Diagnosis not present

## 2015-12-13 DIAGNOSIS — Z87891 Personal history of nicotine dependence: Secondary | ICD-10-CM | POA: Diagnosis not present

## 2015-12-13 DIAGNOSIS — E785 Hyperlipidemia, unspecified: Secondary | ICD-10-CM | POA: Diagnosis not present

## 2015-12-13 DIAGNOSIS — I10 Essential (primary) hypertension: Secondary | ICD-10-CM | POA: Diagnosis not present

## 2015-12-13 DIAGNOSIS — Z23 Encounter for immunization: Secondary | ICD-10-CM | POA: Diagnosis not present

## 2015-12-13 DIAGNOSIS — M25511 Pain in right shoulder: Secondary | ICD-10-CM | POA: Diagnosis not present

## 2015-12-13 DIAGNOSIS — I70292 Other atherosclerosis of native arteries of extremities, left leg: Secondary | ICD-10-CM | POA: Diagnosis not present

## 2015-12-27 DIAGNOSIS — Z87891 Personal history of nicotine dependence: Secondary | ICD-10-CM | POA: Diagnosis not present

## 2015-12-27 DIAGNOSIS — E785 Hyperlipidemia, unspecified: Secondary | ICD-10-CM | POA: Diagnosis not present

## 2015-12-27 DIAGNOSIS — Z0000 Encounter for general adult medical examination without abnormal findings: Secondary | ICD-10-CM | POA: Diagnosis not present

## 2015-12-27 DIAGNOSIS — I70292 Other atherosclerosis of native arteries of extremities, left leg: Secondary | ICD-10-CM | POA: Diagnosis not present

## 2015-12-27 DIAGNOSIS — E119 Type 2 diabetes mellitus without complications: Secondary | ICD-10-CM | POA: Diagnosis not present

## 2015-12-27 DIAGNOSIS — I10 Essential (primary) hypertension: Secondary | ICD-10-CM | POA: Diagnosis not present

## 2016-01-06 ENCOUNTER — Ambulatory Visit: Payer: Medicare Other | Admitting: Podiatry

## 2016-02-28 DIAGNOSIS — E119 Type 2 diabetes mellitus without complications: Secondary | ICD-10-CM | POA: Diagnosis not present

## 2016-02-28 DIAGNOSIS — Z0000 Encounter for general adult medical examination without abnormal findings: Secondary | ICD-10-CM | POA: Diagnosis not present

## 2016-02-28 DIAGNOSIS — E1140 Type 2 diabetes mellitus with diabetic neuropathy, unspecified: Secondary | ICD-10-CM | POA: Diagnosis not present

## 2016-02-28 DIAGNOSIS — E785 Hyperlipidemia, unspecified: Secondary | ICD-10-CM | POA: Diagnosis not present

## 2016-03-10 DIAGNOSIS — Z791 Long term (current) use of non-steroidal anti-inflammatories (NSAID): Secondary | ICD-10-CM | POA: Diagnosis not present

## 2016-03-10 DIAGNOSIS — M25512 Pain in left shoulder: Secondary | ICD-10-CM | POA: Diagnosis not present

## 2016-03-10 DIAGNOSIS — E119 Type 2 diabetes mellitus without complications: Secondary | ICD-10-CM | POA: Diagnosis not present

## 2016-03-10 DIAGNOSIS — S46912A Strain of unspecified muscle, fascia and tendon at shoulder and upper arm level, left arm, initial encounter: Secondary | ICD-10-CM | POA: Diagnosis not present

## 2016-03-14 DIAGNOSIS — H524 Presbyopia: Secondary | ICD-10-CM | POA: Diagnosis not present

## 2016-03-14 DIAGNOSIS — E119 Type 2 diabetes mellitus without complications: Secondary | ICD-10-CM | POA: Diagnosis not present

## 2016-03-14 DIAGNOSIS — H52203 Unspecified astigmatism, bilateral: Secondary | ICD-10-CM | POA: Diagnosis not present

## 2016-03-14 DIAGNOSIS — H2513 Age-related nuclear cataract, bilateral: Secondary | ICD-10-CM | POA: Diagnosis not present

## 2016-03-14 DIAGNOSIS — H5203 Hypermetropia, bilateral: Secondary | ICD-10-CM | POA: Diagnosis not present

## 2016-03-17 DIAGNOSIS — I70292 Other atherosclerosis of native arteries of extremities, left leg: Secondary | ICD-10-CM | POA: Diagnosis not present

## 2016-03-17 DIAGNOSIS — E785 Hyperlipidemia, unspecified: Secondary | ICD-10-CM | POA: Diagnosis not present

## 2016-03-17 DIAGNOSIS — E119 Type 2 diabetes mellitus without complications: Secondary | ICD-10-CM | POA: Diagnosis not present

## 2016-03-17 DIAGNOSIS — I10 Essential (primary) hypertension: Secondary | ICD-10-CM | POA: Diagnosis not present

## 2016-03-17 DIAGNOSIS — E1140 Type 2 diabetes mellitus with diabetic neuropathy, unspecified: Secondary | ICD-10-CM | POA: Diagnosis not present

## 2016-03-30 DIAGNOSIS — H5203 Hypermetropia, bilateral: Secondary | ICD-10-CM | POA: Diagnosis not present

## 2016-03-30 DIAGNOSIS — E119 Type 2 diabetes mellitus without complications: Secondary | ICD-10-CM | POA: Diagnosis not present

## 2016-03-30 DIAGNOSIS — H25013 Cortical age-related cataract, bilateral: Secondary | ICD-10-CM | POA: Diagnosis not present

## 2016-03-30 DIAGNOSIS — H2513 Age-related nuclear cataract, bilateral: Secondary | ICD-10-CM | POA: Diagnosis not present

## 2016-03-30 DIAGNOSIS — H18413 Arcus senilis, bilateral: Secondary | ICD-10-CM | POA: Diagnosis not present

## 2016-04-25 DIAGNOSIS — H5703 Miosis: Secondary | ICD-10-CM | POA: Diagnosis not present

## 2016-04-25 DIAGNOSIS — H269 Unspecified cataract: Secondary | ICD-10-CM | POA: Diagnosis not present

## 2016-04-25 DIAGNOSIS — H2511 Age-related nuclear cataract, right eye: Secondary | ICD-10-CM | POA: Diagnosis not present

## 2016-04-25 DIAGNOSIS — H25011 Cortical age-related cataract, right eye: Secondary | ICD-10-CM | POA: Diagnosis not present

## 2016-06-01 DIAGNOSIS — H524 Presbyopia: Secondary | ICD-10-CM | POA: Diagnosis not present

## 2016-06-01 DIAGNOSIS — H52203 Unspecified astigmatism, bilateral: Secondary | ICD-10-CM | POA: Diagnosis not present

## 2016-06-02 DIAGNOSIS — E1140 Type 2 diabetes mellitus with diabetic neuropathy, unspecified: Secondary | ICD-10-CM | POA: Diagnosis not present

## 2016-06-02 DIAGNOSIS — E785 Hyperlipidemia, unspecified: Secondary | ICD-10-CM | POA: Diagnosis not present

## 2016-06-02 DIAGNOSIS — I70292 Other atherosclerosis of native arteries of extremities, left leg: Secondary | ICD-10-CM | POA: Diagnosis not present

## 2016-06-02 DIAGNOSIS — E119 Type 2 diabetes mellitus without complications: Secondary | ICD-10-CM | POA: Diagnosis not present

## 2016-06-02 DIAGNOSIS — I10 Essential (primary) hypertension: Secondary | ICD-10-CM | POA: Diagnosis not present

## 2016-06-09 DIAGNOSIS — E1165 Type 2 diabetes mellitus with hyperglycemia: Secondary | ICD-10-CM | POA: Diagnosis not present

## 2016-06-09 DIAGNOSIS — E1142 Type 2 diabetes mellitus with diabetic polyneuropathy: Secondary | ICD-10-CM | POA: Diagnosis not present

## 2016-06-27 DIAGNOSIS — E119 Type 2 diabetes mellitus without complications: Secondary | ICD-10-CM | POA: Diagnosis not present

## 2016-07-05 DIAGNOSIS — I70292 Other atherosclerosis of native arteries of extremities, left leg: Secondary | ICD-10-CM | POA: Diagnosis not present

## 2016-07-05 DIAGNOSIS — E1140 Type 2 diabetes mellitus with diabetic neuropathy, unspecified: Secondary | ICD-10-CM | POA: Diagnosis not present

## 2016-07-05 DIAGNOSIS — N3281 Overactive bladder: Secondary | ICD-10-CM | POA: Diagnosis not present

## 2016-07-05 DIAGNOSIS — E785 Hyperlipidemia, unspecified: Secondary | ICD-10-CM | POA: Diagnosis not present

## 2016-07-05 DIAGNOSIS — E119 Type 2 diabetes mellitus without complications: Secondary | ICD-10-CM | POA: Diagnosis not present

## 2016-07-05 DIAGNOSIS — M170 Bilateral primary osteoarthritis of knee: Secondary | ICD-10-CM | POA: Diagnosis not present

## 2016-07-05 DIAGNOSIS — I10 Essential (primary) hypertension: Secondary | ICD-10-CM | POA: Diagnosis not present

## 2016-07-05 DIAGNOSIS — N183 Chronic kidney disease, stage 3 (moderate): Secondary | ICD-10-CM | POA: Diagnosis not present

## 2016-07-05 DIAGNOSIS — I7090 Unspecified atherosclerosis: Secondary | ICD-10-CM | POA: Diagnosis not present

## 2016-09-06 DIAGNOSIS — E119 Type 2 diabetes mellitus without complications: Secondary | ICD-10-CM | POA: Diagnosis not present

## 2016-09-06 DIAGNOSIS — Z87891 Personal history of nicotine dependence: Secondary | ICD-10-CM | POA: Diagnosis not present

## 2016-09-06 DIAGNOSIS — I10 Essential (primary) hypertension: Secondary | ICD-10-CM | POA: Diagnosis not present

## 2016-09-06 DIAGNOSIS — E785 Hyperlipidemia, unspecified: Secondary | ICD-10-CM | POA: Diagnosis not present

## 2016-09-06 DIAGNOSIS — I70292 Other atherosclerosis of native arteries of extremities, left leg: Secondary | ICD-10-CM | POA: Diagnosis not present

## 2016-09-21 DIAGNOSIS — Z7689 Persons encountering health services in other specified circumstances: Secondary | ICD-10-CM | POA: Diagnosis not present

## 2016-09-21 DIAGNOSIS — I10 Essential (primary) hypertension: Secondary | ICD-10-CM | POA: Diagnosis not present

## 2016-09-21 DIAGNOSIS — Z87891 Personal history of nicotine dependence: Secondary | ICD-10-CM | POA: Diagnosis not present

## 2016-09-21 DIAGNOSIS — E119 Type 2 diabetes mellitus without complications: Secondary | ICD-10-CM | POA: Diagnosis not present

## 2016-09-21 DIAGNOSIS — I70292 Other atherosclerosis of native arteries of extremities, left leg: Secondary | ICD-10-CM | POA: Diagnosis not present

## 2016-09-21 DIAGNOSIS — E785 Hyperlipidemia, unspecified: Secondary | ICD-10-CM | POA: Diagnosis not present

## 2016-12-15 DIAGNOSIS — I10 Essential (primary) hypertension: Secondary | ICD-10-CM | POA: Diagnosis not present

## 2016-12-15 DIAGNOSIS — I70292 Other atherosclerosis of native arteries of extremities, left leg: Secondary | ICD-10-CM | POA: Diagnosis not present

## 2016-12-15 DIAGNOSIS — E785 Hyperlipidemia, unspecified: Secondary | ICD-10-CM | POA: Diagnosis not present

## 2016-12-15 DIAGNOSIS — E119 Type 2 diabetes mellitus without complications: Secondary | ICD-10-CM | POA: Diagnosis not present

## 2016-12-15 DIAGNOSIS — M170 Bilateral primary osteoarthritis of knee: Secondary | ICD-10-CM | POA: Diagnosis not present

## 2016-12-25 DIAGNOSIS — I10 Essential (primary) hypertension: Secondary | ICD-10-CM | POA: Diagnosis not present

## 2016-12-25 DIAGNOSIS — E785 Hyperlipidemia, unspecified: Secondary | ICD-10-CM | POA: Diagnosis not present

## 2016-12-25 DIAGNOSIS — Z23 Encounter for immunization: Secondary | ICD-10-CM | POA: Diagnosis not present

## 2016-12-25 DIAGNOSIS — E1140 Type 2 diabetes mellitus with diabetic neuropathy, unspecified: Secondary | ICD-10-CM | POA: Diagnosis not present

## 2016-12-25 DIAGNOSIS — Z87891 Personal history of nicotine dependence: Secondary | ICD-10-CM | POA: Diagnosis not present

## 2016-12-25 DIAGNOSIS — E119 Type 2 diabetes mellitus without complications: Secondary | ICD-10-CM | POA: Diagnosis not present

## 2016-12-25 DIAGNOSIS — M170 Bilateral primary osteoarthritis of knee: Secondary | ICD-10-CM | POA: Diagnosis not present

## 2017-01-08 DIAGNOSIS — I70292 Other atherosclerosis of native arteries of extremities, left leg: Secondary | ICD-10-CM | POA: Diagnosis not present

## 2017-01-08 DIAGNOSIS — I10 Essential (primary) hypertension: Secondary | ICD-10-CM | POA: Diagnosis not present

## 2017-01-08 DIAGNOSIS — M170 Bilateral primary osteoarthritis of knee: Secondary | ICD-10-CM | POA: Diagnosis not present

## 2017-01-08 DIAGNOSIS — E119 Type 2 diabetes mellitus without complications: Secondary | ICD-10-CM | POA: Diagnosis not present

## 2017-01-08 DIAGNOSIS — E785 Hyperlipidemia, unspecified: Secondary | ICD-10-CM | POA: Diagnosis not present

## 2017-02-02 DIAGNOSIS — E119 Type 2 diabetes mellitus without complications: Secondary | ICD-10-CM | POA: Diagnosis not present

## 2017-04-02 DIAGNOSIS — L84 Corns and callosities: Secondary | ICD-10-CM | POA: Diagnosis not present

## 2017-04-02 DIAGNOSIS — Z87891 Personal history of nicotine dependence: Secondary | ICD-10-CM | POA: Diagnosis not present

## 2017-04-02 DIAGNOSIS — I70292 Other atherosclerosis of native arteries of extremities, left leg: Secondary | ICD-10-CM | POA: Diagnosis not present

## 2017-04-02 DIAGNOSIS — I10 Essential (primary) hypertension: Secondary | ICD-10-CM | POA: Diagnosis not present

## 2017-04-02 DIAGNOSIS — M2041 Other hammer toe(s) (acquired), right foot: Secondary | ICD-10-CM | POA: Diagnosis not present

## 2017-04-02 DIAGNOSIS — B351 Tinea unguium: Secondary | ICD-10-CM | POA: Diagnosis not present

## 2017-04-02 DIAGNOSIS — Z0000 Encounter for general adult medical examination without abnormal findings: Secondary | ICD-10-CM | POA: Diagnosis not present

## 2017-04-02 DIAGNOSIS — E119 Type 2 diabetes mellitus without complications: Secondary | ICD-10-CM | POA: Diagnosis not present

## 2017-04-02 DIAGNOSIS — E785 Hyperlipidemia, unspecified: Secondary | ICD-10-CM | POA: Diagnosis not present

## 2017-04-02 DIAGNOSIS — E1151 Type 2 diabetes mellitus with diabetic peripheral angiopathy without gangrene: Secondary | ICD-10-CM | POA: Diagnosis not present

## 2017-04-25 DIAGNOSIS — S63501A Unspecified sprain of right wrist, initial encounter: Secondary | ICD-10-CM | POA: Diagnosis not present

## 2017-04-25 DIAGNOSIS — Y92007 Garden or yard of unspecified non-institutional (private) residence as the place of occurrence of the external cause: Secondary | ICD-10-CM | POA: Diagnosis not present

## 2017-04-25 DIAGNOSIS — S6991XA Unspecified injury of right wrist, hand and finger(s), initial encounter: Secondary | ICD-10-CM | POA: Diagnosis not present

## 2017-04-25 DIAGNOSIS — W010XXA Fall on same level from slipping, tripping and stumbling without subsequent striking against object, initial encounter: Secondary | ICD-10-CM | POA: Diagnosis not present

## 2017-04-25 DIAGNOSIS — Y9301 Activity, walking, marching and hiking: Secondary | ICD-10-CM | POA: Diagnosis not present

## 2017-04-25 DIAGNOSIS — M25531 Pain in right wrist: Secondary | ICD-10-CM | POA: Diagnosis not present

## 2017-05-22 DIAGNOSIS — E119 Type 2 diabetes mellitus without complications: Secondary | ICD-10-CM | POA: Diagnosis not present

## 2017-08-30 ENCOUNTER — Other Ambulatory Visit: Payer: Self-pay

## 2017-08-30 NOTE — Patient Outreach (Signed)
[place] [facility]) Care Management  08/30/2017  Seanpaul Preece. 1940-03-31 [id number]   Medication Adherence call to Mr. Srijan Givan left a message for patient to call back patient is due on Amlodipine/Benazepril 10/20 mg. Mr. Sherrow is showing past due under [place].   [place] Management Direct Dial [phone]  Fax [phone] [email or web address]

## 2017-09-06 DIAGNOSIS — M170 Bilateral primary osteoarthritis of knee: Secondary | ICD-10-CM | POA: Diagnosis not present

## 2017-09-06 DIAGNOSIS — E119 Type 2 diabetes mellitus without complications: Secondary | ICD-10-CM | POA: Diagnosis not present

## 2017-09-06 DIAGNOSIS — I10 Essential (primary) hypertension: Secondary | ICD-10-CM | POA: Diagnosis not present

## 2017-09-06 DIAGNOSIS — E785 Hyperlipidemia, unspecified: Secondary | ICD-10-CM | POA: Diagnosis not present

## 2017-09-06 DIAGNOSIS — Z0000 Encounter for general adult medical examination without abnormal findings: Secondary | ICD-10-CM | POA: Diagnosis not present

## 2017-12-24 ENCOUNTER — Other Ambulatory Visit: Payer: Self-pay

## 2017-12-24 NOTE — Patient Outreach (Signed)
[place] [facility]) Care Management  12/24/2017  Stephon Weathers. March 15, 1940 [id number]   Medication Adherence call to Mr. Deagen Krass spoke with patient he already pick up Amlodepine/Benazepril 10/20 mg from [facility] patient pick up a 90 days supply. Mr. Knipple is showing past due under [place].   [place] Management Direct Dial [phone]  Fax [phone] [email or web address]

## 2018-02-01 DIAGNOSIS — I70292 Other atherosclerosis of native arteries of extremities, left leg: Secondary | ICD-10-CM | POA: Diagnosis not present

## 2018-02-01 DIAGNOSIS — I10 Essential (primary) hypertension: Secondary | ICD-10-CM | POA: Diagnosis not present

## 2018-02-01 DIAGNOSIS — E119 Type 2 diabetes mellitus without complications: Secondary | ICD-10-CM | POA: Diagnosis not present

## 2018-02-01 DIAGNOSIS — E782 Mixed hyperlipidemia: Secondary | ICD-10-CM | POA: Diagnosis not present

## 2018-02-01 DIAGNOSIS — Z87891 Personal history of nicotine dependence: Secondary | ICD-10-CM | POA: Diagnosis not present

## 2018-02-25 DIAGNOSIS — I70292 Other atherosclerosis of native arteries of extremities, left leg: Secondary | ICD-10-CM | POA: Diagnosis not present

## 2018-02-25 DIAGNOSIS — E782 Mixed hyperlipidemia: Secondary | ICD-10-CM | POA: Diagnosis not present

## 2018-02-25 DIAGNOSIS — E119 Type 2 diabetes mellitus without complications: Secondary | ICD-10-CM | POA: Diagnosis not present

## 2018-02-25 DIAGNOSIS — Z87891 Personal history of nicotine dependence: Secondary | ICD-10-CM | POA: Diagnosis not present

## 2018-02-25 DIAGNOSIS — I10 Essential (primary) hypertension: Secondary | ICD-10-CM | POA: Diagnosis not present

## 2018-03-21 ENCOUNTER — Emergency Department (HOSPITAL_BASED_OUTPATIENT_CLINIC_OR_DEPARTMENT_OTHER)
Admission: EM | Admit: 2018-03-21 | Discharge: 2018-03-21 | Disposition: A | Discharge status: Home / Self Care | Payer: Commercial Managed Care - HMO | Attending: Emergency Medicine | Admitting: Emergency Medicine

## 2018-03-21 ENCOUNTER — Other Ambulatory Visit: Payer: Self-pay

## 2018-03-21 ENCOUNTER — Encounter (HOSPITAL_BASED_OUTPATIENT_CLINIC_OR_DEPARTMENT_OTHER): Payer: Self-pay | Admitting: *Deleted

## 2018-03-21 DIAGNOSIS — L02211 Cutaneous abscess of abdominal wall: Secondary | ICD-10-CM | POA: Diagnosis not present

## 2018-03-21 DIAGNOSIS — Z5321 Procedure and treatment not carried out due to patient leaving prior to being seen by health care provider: Secondary | ICD-10-CM | POA: Diagnosis not present

## 2018-03-21 NOTE — ED Triage Notes (Signed)
Pt c/o abscess to lower abd with drainage x 1 week

## 2018-03-29 DIAGNOSIS — Z87891 Personal history of nicotine dependence: Secondary | ICD-10-CM | POA: Diagnosis not present

## 2018-03-29 DIAGNOSIS — E782 Mixed hyperlipidemia: Secondary | ICD-10-CM | POA: Diagnosis not present

## 2018-03-29 DIAGNOSIS — E119 Type 2 diabetes mellitus without complications: Secondary | ICD-10-CM | POA: Diagnosis not present

## 2018-03-29 DIAGNOSIS — I70292 Other atherosclerosis of native arteries of extremities, left leg: Secondary | ICD-10-CM | POA: Diagnosis not present

## 2018-03-29 DIAGNOSIS — I10 Essential (primary) hypertension: Secondary | ICD-10-CM | POA: Diagnosis not present

## 2018-04-08 ENCOUNTER — Other Ambulatory Visit: Payer: Self-pay

## 2018-04-08 NOTE — Patient Outreach (Signed)
[place] [facility]) Care Management  04/08/2018  Peter Bowen. 1941-01-22 [id number]  TELEPHONE SCREENING Referral date: 03/26/18 Referral source: utilization management Referral reason: medication assistance Insurance: United health care Attempt #1  Telephone call to patient regarding utilization management referral. Unable to reach patient. HIPAA compliant voice message left with call back phone number.   PLAN: RNCM will attempt 2nd telephone call to patient within 4 business days. RNCM will send outreach letter.   Quinn Plowman RN,BSN, [place] Telephonic  [phone]

## 2018-04-11 ENCOUNTER — Other Ambulatory Visit: Payer: Self-pay

## 2018-04-11 NOTE — Patient Outreach (Signed)
[facility]) Care Management  04/11/2018  Peter Bowen. 1940/09/15 [id number]  TELEPHONE SCREENING Referral date: 03/26/18 Referral source: utilization management Referral reason: medication assistance Insurance: United health care  Telephone call to patient regarding utilization management referral. HIPAA verified with patient. Explained reason for call.  Patient states he recently lost his job and states he lost his wife in June 2019.  Patient states he is having difficulty affording his medications and paying his bills.  Patient states he normally gets his diabetic strips from a specific company but states the paperwork for the company was thrown away.   Patient states he needs help paying his bills.  RNCM discussed and offered [facility] care management services. Patient verbally agreed.   PLAN; RNCM wil refer patient to [facility] social worker and pharmacist.   Quinn Plowman RN,BSN,CCM [facility] Telephonic  [phone]

## 2018-04-15 ENCOUNTER — Telehealth: Payer: Self-pay | Admitting: Pharmacist

## 2018-04-15 NOTE — Patient Outreach (Signed)
[facility]) Care Management  04/15/2018  Peter Bowen. 09-17-1940 [id number]   Patient was called regarding medication assistance. HIPAA identifiers were obtained. Patient confirmed he was having difficulty affording Novolog FlexPen.  However, he said he was not at home at the time of my call and said he would call me back later today to further discuss options to help him with medication affordability.  I was not able to review the patient's medications but there are programs available for Xarelto and Janumet as well.  Plan: Await a call back from the patient. Call patient back in 2-3 business days if I do not hear back from him first.  [name], PharmD, [place] Clinical Pharmacist [phone]

## 2018-04-18 ENCOUNTER — Other Ambulatory Visit: Payer: Self-pay | Admitting: Pharmacist

## 2018-04-18 DIAGNOSIS — E119 Type 2 diabetes mellitus without complications: Secondary | ICD-10-CM | POA: Diagnosis not present

## 2018-04-18 NOTE — Patient Outreach (Signed)
[place] [facility]) Care Management  [place]   04/18/2018  Daiwik Buffalo. Dec 13, 1940 [id number]  Reason for referral: Medication Assistance with insulin  Referral source: [facility] Medicare Complete Plan 2)  PMHx includes but not limited to:   CKD stage III, Type 2 diabetes, hypertension, and osteoarthritis of left knee  Outreach:  Successful telephone call with patient.  HIPAA identifiers verified.   Subjective:  Patient is a 78 year old male with the above mentioned medical conditions. He expressed concerns affording his medications, specifically Lantus/Basaglar.   Objective: Lab Results  Component Value Date   CREATININE 1.17 12/12/2011   CREATININE 1.24 12/11/2011   CREATININE 1.30 12/10/2011    No results found for: HGBA1C  Lipid Panel  No results found for: CHOL, TRIG, HDL, CHOLHDL, VLDL, LDLCALC, LDLDIRECT  BP Readings from Last 3 Encounters:  03/21/18 125/83  10/07/15 (!) 157/87  12/12/11 (!) 172/75    No Known Allergies  Medications Reviewed Today    Reviewed by [name], Patrick (Pharmacist) on 04/18/18 at 1010  Med List Status: <None>  Medication Order Taking? Sig Documenting Provider Last Dose Status Informant  amLODipine-benazepril (LOTREL) 10-20 MG per capsule [id number] Yes Take 1 capsule by mouth daily with breakfast. [provider] Taking Active Self  aspirin EC 325 MG tablet [id number] Yes Take 1 tablet by mouth daily.  [provider] Taking Active   glimepiride (AMARYL) 4 MG tablet [id number] Yes Take 4 mg by mouth 2 (two) times daily. [provider] Taking Active Self  Insulin Glargine [facility] ) [id number] Yes Inject 25 Units into the skin daily. Benito Mccreedy, MD Taking Active   metFORMIN (GLUCOPHAGE) 1000 MG tablet [id number] Yes Take 1 tablet by mouth daily. [provider] Taking Active   metoprolol succinate (TOPROL-XL) 50 MG  24 hr tablet [id number] Yes Take 50 mg by mouth daily with breakfast. Take with or immediately following a meal. [provider] Taking Active Self          Assessment:  Drugs sorted by system:  Cardiovascular: Amlodipine-benazepril, Metoprolol, Aspirin  Endocrine: Lantus, Metformin, Glimepiride  Medication Review Findings:  . Adherence-Basaglar-Patient reported only using insulin when his blood sugars are high. He was educated about Loss adjuster, chartered being a basal insulin and that it was not designed to be used PRN.   Called [employer]. Patient has a $95 Deductible.    Basaglar is not formulary but he was on Lantus earlier and it would be $131 for a 3 month supply if he had it filled through [facility].  Patient was unaware that he could get his tier 1-2 medications at not cost through [employer].   Medication Assistance Findings:  Medication assistance needs identified.   Extra Help:   '[]'  Already receiving Full Extra Help  '[]'  Already receiving Partial Extra Help  '[x]'  Eligible based on reported income and assets  '[]'  Not Eligible based on reported income and assets  Patient Assistance Programs: 1) Engineer, agricultural made by [place] requirement met: '[x]'  Yes '[]'  No '[]'  Unknown o Out-of-pocket prescription expenditure met:    '[]'  Yes '[]'  No  '[]'  Unknown  '[x]'  Not applicable - Patient has met application requirements to apply for this patient assistance program.    Additional medication assistance options reviewed with patient as warranted:  No other options identified  Plan: Assisted patient with applying for Extra Help. , I will route  patient assistance letter to [place] technician who will coordinate patient assistance program application process for medications listed above.  [facility] pharmacy technician will assist with obtaining all required documents from both patient and provider(s) and submit application(s) once completed. , Will route note to PCP.Marland Kitchen     Follow up with patient in 2 weeks.  [name], PharmD, [place] Clinical Pharmacist [phone]

## 2018-04-19 ENCOUNTER — Other Ambulatory Visit: Payer: Self-pay | Admitting: Pharmacy Technician

## 2018-04-19 NOTE — Patient Outreach (Signed)
[facility] [facility]) Care Management  04/19/2018  Peter Bowen. 12/05/1940 [id number]                 Medication Assistance Referral  Referral From: [place]  Medication/Company: [employer] Patient application portion:  Mailed Provider application portion: Faxed  to [employer]   Follow up:  Will follow up with patient in 5-10 business days to confirm application(s) have been received.  Peter Bowen [name], [place] Management [phone]

## 2018-04-24 ENCOUNTER — Other Ambulatory Visit: Payer: Self-pay | Admitting: *Deleted

## 2018-04-24 ENCOUNTER — Encounter: Payer: Self-pay | Admitting: *Deleted

## 2018-04-24 NOTE — Patient Outreach (Signed)
[facility]) Care Management  04/24/2018  Vallen Calabrese. 08/02/1940 [id number]  CSW was able to make initial contact with patient today to perform phone assessment, as well as assess and assist with social work needs and services.  CSW introduced self, explained role and types of services provided through [place] [employer] ([place] Management).  CSW further explained to patient that CSW works with patient's Telephonic RNCM, also with [place] Management, Quinn Plowman. CSW then explained the reason for the call, indicating that Mrs. Nyoka Cowden thought that patient would benefit from social work services and resources to assist patient with obtaining financial assistance to help pay his monthly expenses.  CSW obtained two HIPAA compliant identifiers from patient, which included patient's name and date of birth.  Patient admitted that he recently lost his job and is now having difficulty managing his household bills.  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  CSW agreed to mail the following list of resources to patient's home: [facility] assistance programs. [employer] 9317807503) offers several services to local families, as funding allows. The Emergency Assistance Program (EAP), which they administer, provides household goods, free food, clothing, and financial aid to people in need in the [facility] area. The EAP program does have some qualification, and counselors will interview clients for financial assistance by written referral only. Referrals need to be made by the Department of Social Services or by other EAP approved human services agencies or charities in the area. Money for resources for emergency assistance are available for security deposits for rent, water, electric, and gas, past due rent, utility bills, past due mortgage payments, food, and clothing.  The [employer] also operates a Building surveyor on the site.  [employer] of [employer], which can be reached at 574-626-9492, offers emergency assistance programs for those in need of help, such as food, rent assistance, a soup kitchen, shelter, and clothing. They are based in [facility] but provide a number of services to those that qualify for assistance.   [place] may be able to offer temporary financial assistance and cash grants for paying rent and utilities. Help may be provided for local county residents who may be experiencing personal crisis when other resources, including government programs, are not available. Call (630) 840-3922 St. [place] Nanny Society, which is based in [place], provides financial assistance of up to $50.00 to help pay for rent, utilities, cooling bills, rent, and prescription medications. The program also provides secondhand furniture to those in need. 308-044-5749 [employer] is a [employer]. The organization can offer emergency assistance for paying rent, electric bills, utilities, food, household products and furniture. They offer extensive emergency and transitional housing for families, children and single women, and also run a 2 and [employer]. [place], [employer], and other aid offered too. [address] [place], [place], [phone] Additional locations of the [employer] are in [place] and other nearby communities. When you have an emergency, need free food, money for basic needs, or just need assistance around Christmas, then the [employer] may have the resources you need. Or they can refer you to nearby agencies.  Howe Program is offered for [facility] families. The federal government created [employer] Program provides a  one-time cash grant  payment to help eligible low-income families pay their electric and heating bills. [address], [place], [place] [address], 365-473-9953 [employer] Emergency Assistance is a program offers emergency utility and rent funds for greater [employer] area residents. The program can also provide counseling and referrals to charities and government programs. Also provides food and a free meal program that serves lunch Mondays - Saturdays and dinner seven days per week to individuals in the community. [address] [place], [place], [phone] [employer] offers affordable apartment and housing communities across [place] and [place]. There is often a waiting list as the capacity of the HUD service is limited. The low income and seniors can access public housing, rental assistance to qualified applicants, and apply for the section 8 rent subsidy program. Other programs from [employer] include Visual merchandiser and [employer]. [address], [place], [place], dial [phone]. Homeless prevention/rehousing programs provides everything from financial help to case management. [place] area and [employer] families (among others) can seek help, which may come if the following fashion. [place] aid can help for bills, rent, water or heating expenses, and housing needs. . Non-monetary support, legal aid, and referrals may be given. . Families with children, or even single moms, may be placed into short term housing. The [facility] provides transitional housing to veterans and the disabled. Clients will also access other services too, including life skills classes, case management, and assistance in finding permanent housing. [address] [place], [place], call ([phone] [place] in [place] is for people who were just evicted or that  are formerly homeless. The non-profit will also help then gain self-sufficiency, find a home or apartment to live in, and also provides information on rent assistance when needed. Dial ([phone] [place] is available in [place]. Families that were evicted or that are homeless can gain information or referrals to shelter, food, clothing, furniture, placement into low income apartments and also emergency financial assistance. Other services include answers to questions or workshops to develop financial skills and life skills coaching, job training, and case management. [address] [place], [place], dial [phone]. The [place]. This can help people save money on their heating and summer cooling bills and is free to low income families. Free upgrades can be made to your home. Phone ([phone] Holiday Programs help the less fortunate in [place] as well as across [facility]. Businesses and individuals donate to a number of Christmas services, and as a result, qualified families living in or near poverty can get free toys for their child, Christmas meals or Thanksgiving food baskets, and other presents. A holiday meal service is also for the elderly and disabled.   Hel[employer] is a faith-based charity that runs an emergency program. There may be support for low income families that are facing a one-time crisis, but they do have a form of income coming in the door.  For more information, [employer] Emergency Assistance Ministry is located at [address] [place], [place]. Appointments can be made by dialing [phone]. . A thrift store has basic goods such as clothing, furniture, or small Christmas toys. . One-time financial aid, often issued as a loan, can be used for rent, deposits, and other bills. . Free soup kitchens and  pantries are in [facility].  Many of the non-profits and programs mentioned above are all inclusive, meaning they can meet many needs of the low income, such as energy bills, food, rent, and more. However, there are several organizations that focus just on rent and housing.  Legal assistance for evictions, foreclosure, and more If you need free legal advice on civil issues, such as foreclosures, evictions, utility bill disconnection, government programs, domestic issues and more. Legal Aid of [facility] [employer]) is a Teacher, adult education firm that provides free legal services and counsel to lower income people, seniors, disabled, and others. The goal is to ensure everyone has access to justice and fair representation.  Call them at 9191685992. [employer] is an Armed forces training and education officer that can provide people with [employer] and funds to pay bills. Their assistance depends on funding, and the demand for help is always very high. They can provide cash to help pay rent, a missed mortgage payment, or gas, electric, and water bills. But the assistance doe not stop there. They also have a food pantry on site, which can provide food once every three (3) months to people who need help. They can also offer a clothing voucher once every three (3) months for a maximum three (3) times. After receiving this voucher over that period of time, applicants can receive this aid one every six (6) months after that. 2141884680. The [place]., 6784100420.  Utility assistance.   [place]. offers job and Database administrator. Resources are focused on helping students obtain the skills and experiences that are necessary to compete in today's challenging and tight job market. The non-profit faith-based community action agency offers internship training's as well as classroom instruction. Economically disadvantaged and challenged individuals and potential employers can use their  services. Tax adviser has locations in [place] and [employer]. They run debt and foreclosure prevention programs for local families. A sampling of the programs offered include both Budget and Housing Counseling. This includes money management, financial advice, budget review and development of a written action plan with a Higher education careers adviser to help solve specific individual financial problems. In addition, housing and mortgage counselors can also provide pre- and post-purchase homeownership counseling, default resolution counseling (to prevent foreclosure) and reverse mortgage counseling. A Debt Management Program allows people and families with a high level of credit card or medical debt to consolidate and repay consumer debt and loans to creditors and rebuild positive credit ratings and scores. [phone]    CSW will follow-up with patient on Thursday, May 02, 2018 around 2:30PM to further assess for social work needs and services, as well as ensure that patient received the packet of resource information mailed to his home by CSW.  Nat Christen, BSW, MSW, LCSW  Licensed Education officer, environmental Health System  Mailing [place] [address] [place], [place] [address] Physical Address-[address], [place], [place] [address] Toll Free Main # [phone] Fax # [phone] Cell # [phone]  Office # [phone] [email or web address]

## 2018-04-25 DIAGNOSIS — E1151 Type 2 diabetes mellitus with diabetic peripheral angiopathy without gangrene: Secondary | ICD-10-CM | POA: Diagnosis not present

## 2018-04-25 DIAGNOSIS — D2371 Other benign neoplasm of skin of right lower limb, including hip: Secondary | ICD-10-CM | POA: Diagnosis not present

## 2018-04-25 DIAGNOSIS — L84 Corns and callosities: Secondary | ICD-10-CM | POA: Diagnosis not present

## 2018-04-25 DIAGNOSIS — M2041 Other hammer toe(s) (acquired), right foot: Secondary | ICD-10-CM | POA: Diagnosis not present

## 2018-04-25 DIAGNOSIS — B351 Tinea unguium: Secondary | ICD-10-CM | POA: Diagnosis not present

## 2018-05-02 ENCOUNTER — Encounter: Payer: Self-pay | Admitting: *Deleted

## 2018-05-02 ENCOUNTER — Other Ambulatory Visit: Payer: Self-pay | Admitting: Pharmacy Technician

## 2018-05-02 ENCOUNTER — Other Ambulatory Visit: Payer: Self-pay | Admitting: *Deleted

## 2018-05-02 NOTE — Patient Outreach (Signed)
[facility]) Care Management  05/02/2018  Peter Bowen. 04-Aug-1940 [id number]    First followup call made to patient in regards to the receipt of his Lilly patient assistance application for Basaglar.  Successful call placed with patient answering the phone, HIPAA identifiers verified.  Inquired if patient had received his patient assistance application in the mail and the patient informed that he had not checked his mail in several days. Patient informed he was not at home presently but would check upon his return. Informed patient to please give me a call to inform me if he had received the application and the patient verbalized that he would.  Will make 2nd followup call in 3-5 business days if call is not returned.  Sueanne Maniaci P. Foch Rosenwald, [place] Management [phone]

## 2018-05-02 NOTE — Patient Outreach (Signed)
[name]) Care Management  05/02/2018  Peter Bowen. 12-05-1940 [id number]   CSW was able to make contact with patient today to follow-up regarding social work services and resources, as well as to ensure that patient received the packet of resource information mailed to his home by CSW.  Patient confirmed that he received the list of resources and that he plans to contact several agencies on the list to see if they are able to offer financial assistance with rent and utilities.  Patient went on to say that he was actually out submitting applications to various agencies and that time of CSW's call.  Patient is hopeful that he will be employed again soon.  CSW will perform a case closure on patient, as all goals of treatment have been met from social work standpoint and no additional social work needs have been identified at this time.  CSW will notify patient's Telephonic RNCM with [place] Management, Quinn Plowman of CSW's plans to close patient's case.  CSW will fax an update to patient's Primary Care Physician, Dr. Iona Beard Osei-Bonsu to ensure that they are aware of CSW's involvement with patient's plan of care.  CSW was able to confirm that patient has the correct contact information for CSW, encouraging patient to contact CSW directly if additional social work needs arise in the near future.    Nat Christen, BSW, MSW, LCSW  Licensed Education officer, environmental Health System  Mailing [place] [address] [place], [place] [address] Physical Address-[address], [place], [place] [address] Toll Free Main # [phone] Fax # [phone] Cell # [phone]  Office # [phone] [email or web address]

## 2018-05-06 DIAGNOSIS — I10 Essential (primary) hypertension: Secondary | ICD-10-CM | POA: Diagnosis not present

## 2018-05-06 DIAGNOSIS — E782 Mixed hyperlipidemia: Secondary | ICD-10-CM | POA: Diagnosis not present

## 2018-05-06 DIAGNOSIS — R05 Cough: Secondary | ICD-10-CM | POA: Diagnosis not present

## 2018-05-06 DIAGNOSIS — Z87891 Personal history of nicotine dependence: Secondary | ICD-10-CM | POA: Diagnosis not present

## 2018-05-06 DIAGNOSIS — E119 Type 2 diabetes mellitus without complications: Secondary | ICD-10-CM | POA: Diagnosis not present

## 2018-05-06 DIAGNOSIS — N183 Chronic kidney disease, stage 3 (moderate): Secondary | ICD-10-CM | POA: Diagnosis not present

## 2018-05-08 ENCOUNTER — Ambulatory Visit: Payer: Self-pay | Admitting: Pharmacist

## 2018-05-08 ENCOUNTER — Other Ambulatory Visit: Payer: Self-pay | Admitting: Pharmacy Technician

## 2018-05-08 NOTE — Patient Outreach (Signed)
[facility]) Care Management  05/08/2018  Peter Bowen. 05/09/1940 [id number]  Successful outreach call placed to patient in regards to [facility] application for [place].  Spoke to patient, HIPAA identifiers verified.  Inqured if patient had received his applications in the mail and he infomred that he believed he had. Inquired if he knew about when he would be able to fill them out. He infomred that he had a lot of papers sitting around the house that he needed to fill out but he was having to babysit more than usually. He says he tries to get to that stuff at night when he mother comes and gets the child. He informs he was not sure when he planned to get it done  Informed patient to outreach me when he has placed the application in the mail. Confirmed patient had my business card with name and number on it.  Will followup in 10-14 business days if patient has not called back and application has not been received.  Zonya Gudger P. Jathen Sudano, [place] Management [phone]

## 2018-05-23 ENCOUNTER — Other Ambulatory Visit: Payer: Self-pay | Admitting: Pharmacy Technician

## 2018-05-23 NOTE — Patient Outreach (Signed)
[facility]) Care Management  05/23/2018  Peter Bowen. 1940/09/10 [id number]    Care coordination call placed to Dr. Iona Beard Osei-Bonsu's office in regards to patient's application for [place].  Spoke to [place] to Social research officer, government directions.  Peter Bowen spoke to Dr. Orma Render and the Nancee Liter should be inject 25 units sq bid for a max of 50 units per day.  Will update application as it previously said use as directed.  Peter Bowen, [place] Management [phone]

## 2018-05-23 NOTE — Patient Outreach (Signed)
[place] [facility]) Care Management  05/23/2018  Jamelle Goldston. [birth year] [id number]   Received all necessary documents and signatures from both patient and provider for [facility] application for [place].  Submitted completed application via fax to [employer].  Will followup with Lilly Cares in 3-5 business days to inquired about status of application.  Tymon Nemetz P. Azyria Osmon, [place] Management [phone]

## 2018-05-27 ENCOUNTER — Other Ambulatory Visit: Payer: Self-pay | Admitting: Pharmacy Technician

## 2018-05-27 DIAGNOSIS — I10 Essential (primary) hypertension: Secondary | ICD-10-CM | POA: Diagnosis not present

## 2018-05-27 DIAGNOSIS — E782 Mixed hyperlipidemia: Secondary | ICD-10-CM | POA: Diagnosis not present

## 2018-05-27 DIAGNOSIS — Z87891 Personal history of nicotine dependence: Secondary | ICD-10-CM | POA: Diagnosis not present

## 2018-05-27 DIAGNOSIS — E119 Type 2 diabetes mellitus without complications: Secondary | ICD-10-CM | POA: Diagnosis not present

## 2018-05-27 DIAGNOSIS — N183 Chronic kidney disease, stage 3 (moderate): Secondary | ICD-10-CM | POA: Diagnosis not present

## 2018-05-27 NOTE — Patient Outreach (Signed)
[place]) Care Management  05/27/2018  Peter Bowen. 25-Aug-1940 [id number]   Care coordination call placed to [facility] in regards to patient's application for [place].  Spoke to Peter Bowen who confirmed patient has been APPROVED for the program 05/23/18/20-12/31/2020.  Peter Bowen says the medication is in progress but has not shipped yet as the approval was just done on Friday. She informed it usually takes 2 business days for the order to get into the system and it usually hips after that.  Will followup with patient in 7-10 business days to confirm receipt of medication.  Peter Bowen, [place] Management [phone]

## 2018-05-29 ENCOUNTER — Other Ambulatory Visit: Payer: Self-pay | Admitting: Pharmacy Technician

## 2018-05-29 NOTE — Patient Outreach (Signed)
[facility]) Care Management  05/29/2018  Saurabh Hettich. Jul 07, 1940 [id number]  Care coordination call placed to Dr. Michaell Cowing office in regards to [employer] application for [employer].  Spoke to [place] and informed her that [employer], [employer], was in need of the provider to call in and give a verbal prescription over the phone for [employer]. Provided Onalee Hua the phone number [phone]. Onalee Hua said she would pass the message to Dr. Orma Render and his clinic staff. Also provided [facility] my phone number.  Will followup with [facility] in 2-7 business days to confirm shipment dates.  Tychelle Purkey P. Keilyn Nadal, [place] Management [phone]

## 2018-06-03 ENCOUNTER — Ambulatory Visit: Payer: Self-pay | Admitting: Pharmacist

## 2018-06-04 ENCOUNTER — Other Ambulatory Visit: Payer: Self-pay | Admitting: Pharmacy Technician

## 2018-06-04 NOTE — Patient Outreach (Signed)
[place] [facility] [facility]) Care Management  06/04/2018  Ignace Mandigo. 1940-10-06 [id number]    Care coordination call placed to [facility] in regards to patient's application for [place].  Spoke to Diane who said the delivery was pending. She transferred me to Collie Siad at [place]. Collie Siad was able to set up delivery. She informed the medication would be sent [employer] and patient should receive the medication on 06/05/2018 at his home.  Will followup with patient in 2-3 business days to confirm he received the medication.  Paislynn Hegstrom P. Neymar Dowe, [place] Management [phone]

## 2018-06-05 ENCOUNTER — Other Ambulatory Visit: Payer: Self-pay | Admitting: Pharmacy Technician

## 2018-06-05 NOTE — Patient Outreach (Signed)
[place] [facility]) Care Management  06/05/2018  Yuan Gann. September 23, 1940 [id number]  Unsuccessful outreach call placed to patient in regards to [facility] application for [employer].   Unfortunately patient did not answer the phone but a HIPAA compliant voicemail was left.  Was calling patient to confirm receipt of Basaglar.  Will followup with patient in 2-3 business days if call is not returned.  Odetta Forness P. Bralyn Folkert, [place] Management [phone]

## 2018-06-07 ENCOUNTER — Other Ambulatory Visit: Payer: Self-pay | Admitting: Pharmacy Technician

## 2018-06-07 NOTE — Patient Outreach (Signed)
[place] [facility] [facility]) Care Management  06/07/2018  Peter Bowen. August 13, 1940 [id number]  Successful outreach call placed to patient in regards to his Lilly application for [employer].  Spoke to patient, HIPAA identifiers verified.  Patient informed that he had received 4 boxes of Basaglar. Discussed with patient how to obtain his refills by calling [facility] and that the phone number was on the label of medication that they sent him via [place]. Patient verbalized understanding and informed he had no other questions or concerns at the present regarding medication assistance. Confirmed patient had our phone numbers here at [facility].  Will route note to [place] for case closure and remove myself from care team as patient assistance has been completed.  Joydan Gretzinger P. Ladelle Teodoro, [place] Management [phone]

## 2018-06-10 ENCOUNTER — Other Ambulatory Visit: Payer: Self-pay | Admitting: Pharmacist

## 2018-06-10 NOTE — Patient Outreach (Addendum)
[place] [facility]) Care Management  06/10/2018  Peter Bowen. 1941-01-03 [id number]   Patient's pharmacy case is being closed because he has received his medications from patient assistance programs and understands how to reorder refills.  Plan: Close patient's pharmacy case. Alert [facility] staff still involved with patient.  [name], PharmD, BCACP [facility] Clinical Pharmacist [phone]  ADDENDUM Patient's case was officially closed and closure letters were sent to the patient and

## 2018-06-20 ENCOUNTER — Ambulatory Visit: Payer: Self-pay | Admitting: Pharmacist

## 2018-07-11 DIAGNOSIS — H52203 Unspecified astigmatism, bilateral: Secondary | ICD-10-CM | POA: Diagnosis not present

## 2018-07-11 DIAGNOSIS — E119 Type 2 diabetes mellitus without complications: Secondary | ICD-10-CM | POA: Diagnosis not present

## 2018-07-11 DIAGNOSIS — Z7984 Long term (current) use of oral hypoglycemic drugs: Secondary | ICD-10-CM | POA: Diagnosis not present

## 2018-07-11 DIAGNOSIS — H5211 Myopia, right eye: Secondary | ICD-10-CM | POA: Diagnosis not present

## 2018-07-11 DIAGNOSIS — H524 Presbyopia: Secondary | ICD-10-CM | POA: Diagnosis not present

## 2018-08-20 DIAGNOSIS — E782 Mixed hyperlipidemia: Secondary | ICD-10-CM | POA: Diagnosis not present

## 2018-08-20 DIAGNOSIS — E119 Type 2 diabetes mellitus without complications: Secondary | ICD-10-CM | POA: Diagnosis not present

## 2018-08-20 DIAGNOSIS — M7989 Other specified soft tissue disorders: Secondary | ICD-10-CM | POA: Diagnosis not present

## 2018-08-20 DIAGNOSIS — Z87891 Personal history of nicotine dependence: Secondary | ICD-10-CM | POA: Diagnosis not present

## 2018-08-20 DIAGNOSIS — I10 Essential (primary) hypertension: Secondary | ICD-10-CM | POA: Diagnosis not present

## 2018-08-28 DIAGNOSIS — Z136 Encounter for screening for cardiovascular disorders: Secondary | ICD-10-CM | POA: Diagnosis not present

## 2018-08-28 DIAGNOSIS — Z0001 Encounter for general adult medical examination with abnormal findings: Secondary | ICD-10-CM | POA: Diagnosis not present

## 2018-08-28 DIAGNOSIS — I10 Essential (primary) hypertension: Secondary | ICD-10-CM | POA: Diagnosis not present

## 2018-08-28 DIAGNOSIS — Z1389 Encounter for screening for other disorder: Secondary | ICD-10-CM | POA: Diagnosis not present

## 2018-08-28 DIAGNOSIS — E782 Mixed hyperlipidemia: Secondary | ICD-10-CM | POA: Diagnosis not present

## 2018-08-28 DIAGNOSIS — E119 Type 2 diabetes mellitus without complications: Secondary | ICD-10-CM | POA: Diagnosis not present

## 2018-08-28 DIAGNOSIS — Z1329 Encounter for screening for other suspected endocrine disorder: Secondary | ICD-10-CM | POA: Diagnosis not present

## 2018-08-28 DIAGNOSIS — Z01118 Encounter for examination of ears and hearing with other abnormal findings: Secondary | ICD-10-CM | POA: Diagnosis not present

## 2018-09-06 DIAGNOSIS — B351 Tinea unguium: Secondary | ICD-10-CM | POA: Diagnosis not present

## 2018-09-06 DIAGNOSIS — D2371 Other benign neoplasm of skin of right lower limb, including hip: Secondary | ICD-10-CM | POA: Diagnosis not present

## 2018-09-06 DIAGNOSIS — E1151 Type 2 diabetes mellitus with diabetic peripheral angiopathy without gangrene: Secondary | ICD-10-CM | POA: Diagnosis not present

## 2018-09-06 DIAGNOSIS — M2041 Other hammer toe(s) (acquired), right foot: Secondary | ICD-10-CM | POA: Diagnosis not present

## 2018-09-25 DIAGNOSIS — E782 Mixed hyperlipidemia: Secondary | ICD-10-CM | POA: Diagnosis not present

## 2018-09-25 DIAGNOSIS — Z87891 Personal history of nicotine dependence: Secondary | ICD-10-CM | POA: Diagnosis not present

## 2018-09-25 DIAGNOSIS — Z0001 Encounter for general adult medical examination with abnormal findings: Secondary | ICD-10-CM | POA: Diagnosis not present

## 2018-09-25 DIAGNOSIS — I10 Essential (primary) hypertension: Secondary | ICD-10-CM | POA: Diagnosis not present

## 2018-09-25 DIAGNOSIS — Z1389 Encounter for screening for other disorder: Secondary | ICD-10-CM | POA: Diagnosis not present

## 2018-09-25 DIAGNOSIS — E119 Type 2 diabetes mellitus without complications: Secondary | ICD-10-CM | POA: Diagnosis not present

## 2018-09-30 DIAGNOSIS — R05 Cough: Secondary | ICD-10-CM | POA: Diagnosis not present

## 2018-10-22 DIAGNOSIS — E782 Mixed hyperlipidemia: Secondary | ICD-10-CM | POA: Diagnosis not present

## 2018-10-22 DIAGNOSIS — Z87891 Personal history of nicotine dependence: Secondary | ICD-10-CM | POA: Diagnosis not present

## 2018-10-22 DIAGNOSIS — I10 Essential (primary) hypertension: Secondary | ICD-10-CM | POA: Diagnosis not present

## 2018-10-22 DIAGNOSIS — E119 Type 2 diabetes mellitus without complications: Secondary | ICD-10-CM | POA: Diagnosis not present

## 2018-10-22 DIAGNOSIS — N3001 Acute cystitis with hematuria: Secondary | ICD-10-CM | POA: Diagnosis not present

## 2018-11-27 DIAGNOSIS — Z23 Encounter for immunization: Secondary | ICD-10-CM | POA: Diagnosis not present

## 2018-11-27 DIAGNOSIS — I10 Essential (primary) hypertension: Secondary | ICD-10-CM | POA: Diagnosis not present

## 2018-11-27 DIAGNOSIS — I70292 Other atherosclerosis of native arteries of extremities, left leg: Secondary | ICD-10-CM | POA: Diagnosis not present

## 2018-11-27 DIAGNOSIS — E782 Mixed hyperlipidemia: Secondary | ICD-10-CM | POA: Diagnosis not present

## 2018-11-27 DIAGNOSIS — Z87891 Personal history of nicotine dependence: Secondary | ICD-10-CM | POA: Diagnosis not present

## 2018-11-27 DIAGNOSIS — E119 Type 2 diabetes mellitus without complications: Secondary | ICD-10-CM | POA: Diagnosis not present

## 2019-01-10 DIAGNOSIS — E1165 Type 2 diabetes mellitus with hyperglycemia: Secondary | ICD-10-CM | POA: Diagnosis not present

## 2019-01-10 DIAGNOSIS — I10 Essential (primary) hypertension: Secondary | ICD-10-CM | POA: Diagnosis not present

## 2019-01-10 DIAGNOSIS — Z87891 Personal history of nicotine dependence: Secondary | ICD-10-CM | POA: Diagnosis not present

## 2019-01-10 DIAGNOSIS — Z794 Long term (current) use of insulin: Secondary | ICD-10-CM | POA: Diagnosis not present

## 2019-01-10 DIAGNOSIS — E782 Mixed hyperlipidemia: Secondary | ICD-10-CM | POA: Diagnosis not present

## 2019-01-31 DIAGNOSIS — Z794 Long term (current) use of insulin: Secondary | ICD-10-CM | POA: Diagnosis not present

## 2019-01-31 DIAGNOSIS — I10 Essential (primary) hypertension: Secondary | ICD-10-CM | POA: Diagnosis not present

## 2019-01-31 DIAGNOSIS — E782 Mixed hyperlipidemia: Secondary | ICD-10-CM | POA: Diagnosis not present

## 2019-01-31 DIAGNOSIS — E1165 Type 2 diabetes mellitus with hyperglycemia: Secondary | ICD-10-CM | POA: Diagnosis not present

## 2019-01-31 DIAGNOSIS — Z87891 Personal history of nicotine dependence: Secondary | ICD-10-CM | POA: Diagnosis not present

## 2019-03-03 DIAGNOSIS — R05 Cough: Secondary | ICD-10-CM | POA: Diagnosis not present

## 2019-03-05 DIAGNOSIS — E871 Hypo-osmolality and hyponatremia: Secondary | ICD-10-CM | POA: Diagnosis not present

## 2019-03-05 DIAGNOSIS — I517 Cardiomegaly: Secondary | ICD-10-CM | POA: Diagnosis not present

## 2019-03-05 DIAGNOSIS — Q2546 Tortuous aortic arch: Secondary | ICD-10-CM | POA: Diagnosis not present

## 2019-03-05 DIAGNOSIS — R748 Abnormal levels of other serum enzymes: Secondary | ICD-10-CM | POA: Diagnosis not present

## 2019-03-05 DIAGNOSIS — Z5320 Procedure and treatment not carried out because of patient's decision for unspecified reasons: Secondary | ICD-10-CM | POA: Diagnosis not present

## 2019-03-05 DIAGNOSIS — R430 Anosmia: Secondary | ICD-10-CM | POA: Diagnosis not present

## 2019-03-05 DIAGNOSIS — R918 Other nonspecific abnormal finding of lung field: Secondary | ICD-10-CM | POA: Diagnosis not present

## 2019-03-05 DIAGNOSIS — R5383 Other fatigue: Secondary | ICD-10-CM | POA: Diagnosis not present

## 2019-03-05 DIAGNOSIS — R432 Parageusia: Secondary | ICD-10-CM | POA: Diagnosis not present

## 2019-03-05 DIAGNOSIS — U071 COVID-19: Secondary | ICD-10-CM | POA: Diagnosis not present

## 2019-03-05 DIAGNOSIS — R05 Cough: Secondary | ICD-10-CM | POA: Diagnosis not present

## 2019-03-05 DIAGNOSIS — E1165 Type 2 diabetes mellitus with hyperglycemia: Secondary | ICD-10-CM | POA: Diagnosis not present

## 2019-03-05 DIAGNOSIS — E872 Acidosis: Secondary | ICD-10-CM | POA: Diagnosis not present

## 2019-03-08 DIAGNOSIS — Z743 Need for continuous supervision: Secondary | ICD-10-CM | POA: Diagnosis not present

## 2019-03-08 DIAGNOSIS — Z794 Long term (current) use of insulin: Secondary | ICD-10-CM | POA: Diagnosis not present

## 2019-03-08 DIAGNOSIS — R0602 Shortness of breath: Secondary | ICD-10-CM | POA: Diagnosis not present

## 2019-03-08 DIAGNOSIS — I10 Essential (primary) hypertension: Secondary | ICD-10-CM | POA: Diagnosis not present

## 2019-03-08 DIAGNOSIS — E1122 Type 2 diabetes mellitus with diabetic chronic kidney disease: Secondary | ICD-10-CM | POA: Diagnosis not present

## 2019-03-08 DIAGNOSIS — U071 COVID-19: Secondary | ICD-10-CM | POA: Diagnosis not present

## 2019-03-08 DIAGNOSIS — R2689 Other abnormalities of gait and mobility: Secondary | ICD-10-CM | POA: Diagnosis not present

## 2019-03-08 DIAGNOSIS — R509 Fever, unspecified: Secondary | ICD-10-CM | POA: Diagnosis not present

## 2019-03-08 DIAGNOSIS — R0902 Hypoxemia: Secondary | ICD-10-CM | POA: Diagnosis not present

## 2019-03-08 DIAGNOSIS — N183 Chronic kidney disease, stage 3 unspecified: Secondary | ICD-10-CM | POA: Diagnosis not present

## 2019-03-08 DIAGNOSIS — I129 Hypertensive chronic kidney disease with stage 1 through stage 4 chronic kidney disease, or unspecified chronic kidney disease: Secondary | ICD-10-CM | POA: Diagnosis not present

## 2019-03-08 DIAGNOSIS — E1165 Type 2 diabetes mellitus with hyperglycemia: Secondary | ICD-10-CM | POA: Diagnosis not present

## 2019-03-08 DIAGNOSIS — R110 Nausea: Secondary | ICD-10-CM | POA: Diagnosis not present

## 2019-03-09 DIAGNOSIS — E08618 Diabetes mellitus due to underlying condition with other diabetic arthropathy: Secondary | ICD-10-CM | POA: Diagnosis not present

## 2019-03-09 DIAGNOSIS — U071 COVID-19: Secondary | ICD-10-CM | POA: Diagnosis not present

## 2019-03-09 DIAGNOSIS — Z794 Long term (current) use of insulin: Secondary | ICD-10-CM | POA: Diagnosis not present

## 2019-03-09 DIAGNOSIS — R2681 Unsteadiness on feet: Secondary | ICD-10-CM | POA: Diagnosis not present

## 2019-03-09 DIAGNOSIS — E0865 Diabetes mellitus due to underlying condition with hyperglycemia: Secondary | ICD-10-CM | POA: Diagnosis not present

## 2019-03-26 DIAGNOSIS — E1165 Type 2 diabetes mellitus with hyperglycemia: Secondary | ICD-10-CM | POA: Diagnosis not present

## 2019-03-26 DIAGNOSIS — Z1329 Encounter for screening for other suspected endocrine disorder: Secondary | ICD-10-CM | POA: Diagnosis not present

## 2019-03-26 DIAGNOSIS — Z794 Long term (current) use of insulin: Secondary | ICD-10-CM | POA: Diagnosis not present

## 2019-03-26 DIAGNOSIS — Z0001 Encounter for general adult medical examination with abnormal findings: Secondary | ICD-10-CM | POA: Diagnosis not present

## 2019-03-29 DIAGNOSIS — E119 Type 2 diabetes mellitus without complications: Secondary | ICD-10-CM | POA: Diagnosis not present

## 2019-04-09 DIAGNOSIS — I10 Essential (primary) hypertension: Secondary | ICD-10-CM | POA: Diagnosis not present

## 2019-04-09 DIAGNOSIS — Z794 Long term (current) use of insulin: Secondary | ICD-10-CM | POA: Diagnosis not present

## 2019-04-09 DIAGNOSIS — E1165 Type 2 diabetes mellitus with hyperglycemia: Secondary | ICD-10-CM | POA: Diagnosis not present

## 2019-04-09 DIAGNOSIS — E1140 Type 2 diabetes mellitus with diabetic neuropathy, unspecified: Secondary | ICD-10-CM | POA: Diagnosis not present

## 2019-04-09 DIAGNOSIS — E782 Mixed hyperlipidemia: Secondary | ICD-10-CM | POA: Diagnosis not present
